# Patient Record
Sex: Female | Born: 1935 | Race: White | Hispanic: No | Marital: Married | Smoking: Never smoker
Health system: Southern US, Community
[De-identification: ages and names within clinical notes are randomized; demographics above are authoritative.]

## PROBLEM LIST (undated history)

## (undated) DIAGNOSIS — E119 Type 2 diabetes mellitus without complications: Secondary | ICD-10-CM

## (undated) DIAGNOSIS — I639 Cerebral infarction, unspecified: Secondary | ICD-10-CM

## (undated) HISTORY — PX: CHOLECYSTECTOMY: SHX55

---

## 2001-06-11 ENCOUNTER — Encounter: Payer: Self-pay | Admitting: Family Medicine

## 2001-06-11 ENCOUNTER — Ambulatory Visit (HOSPITAL_COMMUNITY): Admission: RE | Admit: 2001-06-11 | Discharge: 2001-06-11 | Payer: Self-pay | Admitting: Family Medicine

## 2001-07-21 ENCOUNTER — Ambulatory Visit (HOSPITAL_COMMUNITY): Admission: RE | Admit: 2001-07-21 | Discharge: 2001-07-21 | Payer: Self-pay | Admitting: Family Medicine

## 2001-07-21 ENCOUNTER — Encounter: Payer: Self-pay | Admitting: Family Medicine

## 2001-08-25 ENCOUNTER — Ambulatory Visit (HOSPITAL_COMMUNITY): Admission: RE | Admit: 2001-08-25 | Discharge: 2001-08-25 | Payer: Self-pay | Admitting: Family Medicine

## 2001-08-25 ENCOUNTER — Encounter: Payer: Self-pay | Admitting: Family Medicine

## 2002-05-26 ENCOUNTER — Encounter (HOSPITAL_COMMUNITY): Admission: RE | Admit: 2002-05-26 | Discharge: 2002-06-25 | Payer: Self-pay | Admitting: Family Medicine

## 2002-06-29 ENCOUNTER — Other Ambulatory Visit: Admission: RE | Admit: 2002-06-29 | Discharge: 2002-06-29 | Payer: Self-pay | Admitting: Dermatology

## 2002-07-08 ENCOUNTER — Encounter: Payer: Self-pay | Admitting: Family Medicine

## 2002-07-08 ENCOUNTER — Ambulatory Visit (HOSPITAL_COMMUNITY): Admission: RE | Admit: 2002-07-08 | Discharge: 2002-07-08 | Payer: Self-pay | Admitting: Family Medicine

## 2003-07-09 ENCOUNTER — Ambulatory Visit (HOSPITAL_COMMUNITY): Admission: RE | Admit: 2003-07-09 | Discharge: 2003-07-09 | Payer: Self-pay | Admitting: Internal Medicine

## 2003-07-13 ENCOUNTER — Ambulatory Visit (HOSPITAL_COMMUNITY): Admission: RE | Admit: 2003-07-13 | Discharge: 2003-07-13 | Payer: Self-pay | Admitting: Family Medicine

## 2003-07-13 ENCOUNTER — Encounter (INDEPENDENT_AMBULATORY_CARE_PROVIDER_SITE_OTHER): Payer: Self-pay | Admitting: Internal Medicine

## 2003-07-13 ENCOUNTER — Encounter: Payer: Self-pay | Admitting: Family Medicine

## 2003-07-13 ENCOUNTER — Ambulatory Visit (HOSPITAL_COMMUNITY): Admission: RE | Admit: 2003-07-13 | Discharge: 2003-07-13 | Payer: Self-pay | Admitting: Internal Medicine

## 2004-01-11 ENCOUNTER — Inpatient Hospital Stay (HOSPITAL_COMMUNITY): Admission: RE | Admit: 2004-01-11 | Discharge: 2004-01-14 | Payer: Self-pay | Admitting: General Surgery

## 2004-12-04 ENCOUNTER — Ambulatory Visit (HOSPITAL_COMMUNITY): Admission: RE | Admit: 2004-12-04 | Discharge: 2004-12-04 | Payer: Self-pay | Admitting: Family Medicine

## 2005-02-07 ENCOUNTER — Ambulatory Visit (HOSPITAL_COMMUNITY): Admission: RE | Admit: 2005-02-07 | Discharge: 2005-02-07 | Payer: Self-pay | Admitting: Family Medicine

## 2005-03-29 ENCOUNTER — Ambulatory Visit: Payer: Self-pay | Admitting: Internal Medicine

## 2005-04-05 ENCOUNTER — Ambulatory Visit: Payer: Self-pay | Admitting: Internal Medicine

## 2005-05-02 ENCOUNTER — Ambulatory Visit: Payer: Self-pay | Admitting: Internal Medicine

## 2005-05-02 ENCOUNTER — Ambulatory Visit (HOSPITAL_COMMUNITY): Admission: RE | Admit: 2005-05-02 | Discharge: 2005-05-02 | Payer: Self-pay | Admitting: Internal Medicine

## 2005-05-03 ENCOUNTER — Ambulatory Visit (HOSPITAL_COMMUNITY): Admission: RE | Admit: 2005-05-03 | Discharge: 2005-05-03 | Payer: Self-pay | Admitting: Otolaryngology

## 2005-06-05 ENCOUNTER — Ambulatory Visit: Payer: Self-pay | Admitting: Internal Medicine

## 2005-09-13 ENCOUNTER — Ambulatory Visit: Payer: Self-pay | Admitting: Internal Medicine

## 2005-12-10 ENCOUNTER — Ambulatory Visit (HOSPITAL_COMMUNITY): Admission: RE | Admit: 2005-12-10 | Discharge: 2005-12-10 | Payer: Self-pay | Admitting: Family Medicine

## 2006-09-12 ENCOUNTER — Ambulatory Visit: Payer: Self-pay | Admitting: Internal Medicine

## 2007-01-13 ENCOUNTER — Ambulatory Visit (HOSPITAL_COMMUNITY): Admission: RE | Admit: 2007-01-13 | Discharge: 2007-01-13 | Payer: Self-pay | Admitting: Internal Medicine

## 2007-07-19 ENCOUNTER — Encounter: Payer: Self-pay | Admitting: Orthopedic Surgery

## 2008-02-13 ENCOUNTER — Ambulatory Visit (HOSPITAL_COMMUNITY): Admission: RE | Admit: 2008-02-13 | Discharge: 2008-02-13 | Payer: Self-pay | Admitting: Internal Medicine

## 2009-04-01 ENCOUNTER — Ambulatory Visit (HOSPITAL_COMMUNITY): Admission: RE | Admit: 2009-04-01 | Discharge: 2009-04-01 | Payer: Self-pay | Admitting: Internal Medicine

## 2009-05-26 ENCOUNTER — Ambulatory Visit (HOSPITAL_COMMUNITY): Admission: RE | Admit: 2009-05-26 | Discharge: 2009-05-26 | Payer: Self-pay | Admitting: Internal Medicine

## 2009-07-18 ENCOUNTER — Ambulatory Visit (HOSPITAL_COMMUNITY): Admission: RE | Admit: 2009-07-18 | Discharge: 2009-07-18 | Payer: Self-pay | Admitting: Orthopedic Surgery

## 2009-07-18 ENCOUNTER — Encounter: Payer: Self-pay | Admitting: Orthopedic Surgery

## 2009-07-21 ENCOUNTER — Ambulatory Visit: Payer: Self-pay | Admitting: Orthopedic Surgery

## 2009-07-21 DIAGNOSIS — M5137 Other intervertebral disc degeneration, lumbosacral region: Secondary | ICD-10-CM

## 2009-07-27 ENCOUNTER — Encounter (INDEPENDENT_AMBULATORY_CARE_PROVIDER_SITE_OTHER): Payer: Self-pay | Admitting: *Deleted

## 2009-07-27 ENCOUNTER — Encounter: Payer: Self-pay | Admitting: Orthopedic Surgery

## 2009-08-01 ENCOUNTER — Telehealth: Payer: Self-pay | Admitting: Orthopedic Surgery

## 2009-08-08 ENCOUNTER — Encounter: Payer: Self-pay | Admitting: Orthopedic Surgery

## 2009-08-15 ENCOUNTER — Ambulatory Visit: Payer: Self-pay | Admitting: Orthopedic Surgery

## 2009-08-15 ENCOUNTER — Encounter (INDEPENDENT_AMBULATORY_CARE_PROVIDER_SITE_OTHER): Payer: Self-pay | Admitting: *Deleted

## 2009-08-16 ENCOUNTER — Telehealth: Payer: Self-pay | Admitting: Orthopedic Surgery

## 2009-08-24 ENCOUNTER — Encounter (INDEPENDENT_AMBULATORY_CARE_PROVIDER_SITE_OTHER): Payer: Self-pay | Admitting: *Deleted

## 2009-08-30 ENCOUNTER — Encounter: Admission: RE | Admit: 2009-08-30 | Discharge: 2009-08-30 | Payer: Self-pay | Admitting: Orthopedic Surgery

## 2009-09-13 ENCOUNTER — Encounter: Admission: RE | Admit: 2009-09-13 | Discharge: 2009-09-13 | Payer: Self-pay | Admitting: Orthopedic Surgery

## 2009-12-29 ENCOUNTER — Ambulatory Visit (HOSPITAL_COMMUNITY): Admission: RE | Admit: 2009-12-29 | Discharge: 2009-12-29 | Payer: Self-pay | Admitting: Ophthalmology

## 2010-01-19 ENCOUNTER — Ambulatory Visit (HOSPITAL_COMMUNITY): Admission: RE | Admit: 2010-01-19 | Discharge: 2010-01-19 | Payer: Self-pay | Admitting: Ophthalmology

## 2010-05-17 ENCOUNTER — Ambulatory Visit (HOSPITAL_COMMUNITY): Admission: RE | Admit: 2010-05-17 | Discharge: 2010-05-17 | Payer: Self-pay | Admitting: Internal Medicine

## 2010-07-06 ENCOUNTER — Ambulatory Visit (HOSPITAL_COMMUNITY): Admission: RE | Admit: 2010-07-06 | Discharge: 2010-07-06 | Payer: Self-pay | Admitting: Endocrinology

## 2010-09-14 ENCOUNTER — Emergency Department (HOSPITAL_COMMUNITY)
Admission: EM | Admit: 2010-09-14 | Discharge: 2010-09-14 | Payer: Self-pay | Source: Home / Self Care | Admitting: Emergency Medicine

## 2010-10-23 ENCOUNTER — Ambulatory Visit
Admission: RE | Admit: 2010-10-23 | Discharge: 2010-10-23 | Payer: Self-pay | Source: Home / Self Care | Attending: Internal Medicine | Admitting: Internal Medicine

## 2010-10-27 NOTE — Consult Note (Signed)
  NAMEEILIANA, [name]          ACCOUNT NO.:  [id number]  MEDICAL RECORD NO.:  [id number]          PATIENT TYPE:  EMS  LOCATION:  ED                            FACILITY:  [facility]  PHYSICIAN:  Lionel December, M.D.    DATE OF BIRTH:  06-Nov-1930  DATE OF CONSULTATION: DATE OF DISCHARGE:  09/14/2010                                CONSULTATION   ADDENDUM:  RECOMMENDATIONS:  We will schedule a colonoscopy with Dr. Karilyn Cota.  Her last colonoscopy was in 2006.  She has stopped the Motrin.  Her epigastric pain is better.  She will increase fiber in her diet.    ______________________________ Dorene Ar, NP   ______________________________ Lionel December, M.D.    TS/MEDQ  D:  10/23/2010  T:  10/24/2010  Job:  [id number]  Electronically Signed by Dorene Ar PA on 10/26/2010 05:24:07 PM Electronically Signed by Lionel December M.D. on 10/27/2010 12:48:08 PM

## 2010-10-27 NOTE — Consult Note (Signed)
[name]          ACCOUNT NO.:  [id number]  MEDICAL RECORD NO.:  [id number]          PATIENT TYPE:  EMS  LOCATION:  ED                            FACILITY:  [facility]  PHYSICIAN:  Lionel December, M.D.    DATE OF BIRTH:  1930/12/31  DATE OF CONSULTATION: DATE OF DISCHARGE:  09/14/2010                                CONSULTATION   REASON FOR CONSULT:  Gastritis, gastroparesis.  HISTORY OF PRESENT ILLNESS:  [name] is a 77 year old female of Dr. Scharlene Gloss presenting today with complaints of loose stools, gas and bloating, and epigastric pain. She states she is having 2-3 loose stools a day for about a month.  She, however, does state her stools are not always loose, but soft.  She thinks the Janumet that she started a month ago may have contributed to her loose stools.  She occasionally has esophageal burning. She occasionally has acid reflux.  She complains of bloating after eating. She continues to have epigastric pain and frequent burping. She had been on Motrin 800mg  BID, but stopped a month ago. Her normal weight is 175.  She is down to 163, but this weight loss has been intentional. She has not been on any recent antibiotics and she has not been out  of the country.  Her last colonoscopy and EGD was May 02, 2005.  The EGD revealed normal examination of the esophagus, nonerosive antral gastritis, coarse appearance to post bulbar mucosa, biopsy taken to rule out mucosal disease. Stomach biopsy revealed benign fragments of mucosal and villous  architecture reminiscent of duodental orgin. Colonoscopy revealed no evidence of colitis. Three small polyps, two were at the hepatic flexure and one at the cecum, appendiceal orifice, a tiny nonbleeding,                                                     AV malformation which was left alone and small external hemorrhoids.  The biopsy of the colon revealed adenomatous polyp with predominately villous architecture. No tumor  seen. Recommendations were colonoscopy in 5 years.  MEDICATIONS: 1. She is on Align one a day. 2. Calcium 1200 mg plus one a day. 3. Vitamin D3 1000 units once a day. 4. Motrin/hydrochlorothiazide 100/25 tabs daily. 5. Janumet 50 mg/100 mg b.i.d. 6. Gas-X at bedtime. 7. Xanax 0.5 as needed. 8. ASA 81 mg which is on hold right now. 9. WelChol 3.75 mg a day. 10.She was taking alendronate 70 mg, but not now. 11.Prilosec one a day.  ALLERGIES:  She is allergic to CODEINE, whole ASPIRIN and MOTRIN hurt her stomach.  SURGERIES:  She has had a cholecystectomy x2, one was to remove the gallstones, one was to remove the actual gallbladder.  She has had a partial hysterectomy.  She has had bilateral cataracts surgery.  PAST MEDICAL HISTORY:  Hypertension, high cholesterol and she has been a diabetic type 2 for well over a year.  FAMILY HISTORY:  Her mother is deceased from a CVA.  Her father is deceased from a CVA.  Six sisters, 2 are alive and 4 are deceased.  She really could not tell me the history.  She had 4 brothers, one is deceased from COPD and three are alive.  SOCIAL HISTORY:  She is married.  She is retired from farming.  She does not smoke, drink or do drugs and she has 3 children in good health.  PHYSICAL EXAMINATION:  VITAL SIGNS:  Her weight is 163.3, her height is 5 feet, her BMI is 32, temp is 98.1, blood pressure is 110/60, pulse is 76. MOUTH:  Natural teeth.  Her oral mucosa is moist and there are no lesions. EYES:  The conjunctivae are pink.  Her sclerae are anicteric. NECK:  Her thyroid is normal.  There is no cervical lymphadenopathy. LUNGS:  Clear. HEART:  Regular rate and rhythm. ABDOMEN:  Soft.  Bowel sounds are positive.  No masses.  No tenderness.  ASSESSMENT:  [name] is a 77 year old female with complaints of loose stools and bloating for about a month. She also continues to have epigastric pain. She does have occasional acid reflux. PUD also  needs to be ruled.  Hx of adenomatous polyps in 2006.  RECOMMENDATIONS:  She will increase fiber in her diet and we will schedule her for colonoscopy given her history of adenomatous polyps .Will also schedule an EGD to rule out PUD.     ______________________________ Dorene Ar, NP   ______________________________ Lionel December, M.D.    TS/MEDQ  D:  10/23/2010  T:  10/24/2010  Job:  [id number]  cc:   Catalina Pizza, M.D. Fax: [phone]  Electronically Signed by Dorene Ar PA on 10/24/2010 09:12:51 AM Electronically Signed by Lionel December M.D. on 10/27/2010 12:47:53 PM

## 2010-11-23 ENCOUNTER — Encounter (HOSPITAL_BASED_OUTPATIENT_CLINIC_OR_DEPARTMENT_OTHER): Payer: Medicare HMO | Admitting: Internal Medicine

## 2010-11-23 ENCOUNTER — Other Ambulatory Visit (INDEPENDENT_AMBULATORY_CARE_PROVIDER_SITE_OTHER): Payer: Self-pay | Admitting: Internal Medicine

## 2010-11-23 ENCOUNTER — Ambulatory Visit (HOSPITAL_COMMUNITY)
Admission: RE | Admit: 2010-11-23 | Discharge: 2010-11-23 | Disposition: A | Discharge status: Home / Self Care | Payer: Medicare HMO | Source: Ambulatory Visit | Attending: Internal Medicine | Admitting: Internal Medicine

## 2010-11-23 DIAGNOSIS — R110 Nausea: Secondary | ICD-10-CM | POA: Insufficient documentation

## 2010-11-23 DIAGNOSIS — R197 Diarrhea, unspecified: Secondary | ICD-10-CM

## 2010-11-23 DIAGNOSIS — R1013 Epigastric pain: Secondary | ICD-10-CM

## 2010-11-23 DIAGNOSIS — Z860100 Personal history of colon polyps, unspecified: Secondary | ICD-10-CM | POA: Insufficient documentation

## 2010-11-23 DIAGNOSIS — I10 Essential (primary) hypertension: Secondary | ICD-10-CM | POA: Insufficient documentation

## 2010-11-23 DIAGNOSIS — K259 Gastric ulcer, unspecified as acute or chronic, without hemorrhage or perforation: Secondary | ICD-10-CM | POA: Insufficient documentation

## 2010-11-23 DIAGNOSIS — D126 Benign neoplasm of colon, unspecified: Secondary | ICD-10-CM

## 2010-11-23 DIAGNOSIS — Z7982 Long term (current) use of aspirin: Secondary | ICD-10-CM | POA: Insufficient documentation

## 2010-11-23 DIAGNOSIS — Z79899 Other long term (current) drug therapy: Secondary | ICD-10-CM | POA: Insufficient documentation

## 2010-11-23 DIAGNOSIS — K255 Chronic or unspecified gastric ulcer with perforation: Secondary | ICD-10-CM

## 2010-11-23 DIAGNOSIS — R141 Gas pain: Secondary | ICD-10-CM

## 2010-11-23 DIAGNOSIS — K644 Residual hemorrhoidal skin tags: Secondary | ICD-10-CM | POA: Insufficient documentation

## 2010-11-23 DIAGNOSIS — E119 Type 2 diabetes mellitus without complications: Secondary | ICD-10-CM | POA: Insufficient documentation

## 2010-11-23 LAB — GLUCOSE, CAPILLARY

## 2010-11-24 LAB — H. PYLORI ANTIBODY, IGG: H Pylori IgG: 0.73 {ISR}

## 2010-11-25 ENCOUNTER — Inpatient Hospital Stay (HOSPITAL_COMMUNITY)
Admission: EM | Admit: 2010-11-25 | Discharge: 2010-11-28 | DRG: 066 | Disposition: A | Discharge status: Home / Self Care | Payer: Medicare HMO | Attending: Internal Medicine | Admitting: Internal Medicine

## 2010-11-25 ENCOUNTER — Emergency Department (HOSPITAL_COMMUNITY): Payer: Medicare HMO

## 2010-11-25 DIAGNOSIS — R1310 Dysphagia, unspecified: Secondary | ICD-10-CM | POA: Diagnosis present

## 2010-11-25 DIAGNOSIS — E119 Type 2 diabetes mellitus without complications: Secondary | ICD-10-CM | POA: Diagnosis present

## 2010-11-25 DIAGNOSIS — I672 Cerebral atherosclerosis: Secondary | ICD-10-CM | POA: Diagnosis present

## 2010-11-25 DIAGNOSIS — R471 Dysarthria and anarthria: Secondary | ICD-10-CM | POA: Diagnosis present

## 2010-11-25 DIAGNOSIS — R29810 Facial weakness: Secondary | ICD-10-CM | POA: Diagnosis present

## 2010-11-25 DIAGNOSIS — E785 Hyperlipidemia, unspecified: Secondary | ICD-10-CM | POA: Diagnosis present

## 2010-11-25 DIAGNOSIS — I6350 Cerebral infarction due to unspecified occlusion or stenosis of unspecified cerebral artery: Principal | ICD-10-CM | POA: Diagnosis present

## 2010-11-25 DIAGNOSIS — E876 Hypokalemia: Secondary | ICD-10-CM | POA: Diagnosis present

## 2010-11-25 DIAGNOSIS — R279 Unspecified lack of coordination: Secondary | ICD-10-CM | POA: Diagnosis present

## 2010-11-25 DIAGNOSIS — K259 Gastric ulcer, unspecified as acute or chronic, without hemorrhage or perforation: Secondary | ICD-10-CM | POA: Diagnosis present

## 2010-11-25 DIAGNOSIS — I10 Essential (primary) hypertension: Secondary | ICD-10-CM | POA: Diagnosis present

## 2010-11-25 LAB — APTT: aPTT: 31 seconds (ref 24–37)

## 2010-11-25 LAB — HEMOGLOBIN A1C
Hgb A1c MFr Bld: 6.1 % — ABNORMAL HIGH (ref <5.7)
Mean Plasma Glucose: 128 mg/dL — ABNORMAL HIGH (ref <117)

## 2010-11-25 LAB — URINALYSIS, ROUTINE W REFLEX MICROSCOPIC
Bilirubin Urine: NEGATIVE
Nitrite: NEGATIVE
Specific Gravity, Urine: <1.005 — ABNORMAL LOW (ref 1.005–1.030)
Urobilinogen, UA: 0.2 mg/dL (ref 0.0–1.0)
pH: 5.5 (ref 5.0–8.0)

## 2010-11-25 LAB — HEPATIC FUNCTION PANEL
AST: 23 U/L (ref 0–37)
Bilirubin, Direct: 0.1 mg/dL (ref 0.0–0.3)
Indirect Bilirubin: 0.5 mg/dL (ref 0.3–0.9)
Total Bilirubin: 0.6 mg/dL (ref 0.3–1.2)

## 2010-11-25 LAB — CK TOTAL AND CKMB (NOT AT ARMC)
CK, MB: 5.5 ng/mL — ABNORMAL HIGH (ref 0.3–4.0)
Total CK: 154 U/L (ref 7–177)

## 2010-11-25 LAB — GLUCOSE, CAPILLARY
Glucose-Capillary: 138 mg/dL — ABNORMAL HIGH (ref 70–99)
Glucose-Capillary: 152 mg/dL — ABNORMAL HIGH (ref 70–99)
Glucose-Capillary: 127 mg/dL — ABNORMAL HIGH (ref 70–99)

## 2010-11-25 LAB — CBC
HCT: 39.5 % (ref 36.0–46.0)
Hemoglobin: 13.5 g/dL (ref 12.0–15.0)
RBC: 4.63 MIL/uL (ref 3.87–5.11)
WBC: 8.7 10*3/uL (ref 4.0–10.5)

## 2010-11-25 LAB — PROTIME-INR
INR: 0.85 (ref 0.00–1.49)
Prothrombin Time: 11.8 seconds (ref 11.6–15.2)

## 2010-11-25 LAB — MAGNESIUM: Magnesium: 1.1 mg/dL — ABNORMAL LOW (ref 1.5–2.5)

## 2010-11-25 LAB — DIFFERENTIAL
Basophils Absolute: 0 10*3/uL (ref 0.0–0.1)
Basophils Relative: 0 % (ref 0–1)
Lymphocytes Relative: 20 % (ref 12–46)
Monocytes Absolute: 0.4 10*3/uL (ref 0.1–1.0)
Neutro Abs: 6.5 10*3/uL (ref 1.7–7.7)
Neutrophils Relative %: 75 % (ref 43–77)

## 2010-11-25 LAB — BASIC METABOLIC PANEL
CO2: 27 mEq/L (ref 19–32)
Calcium: 9.1 mg/dL (ref 8.4–10.5)
GFR calc Af Amer: >60 mL/min (ref >60)
Glucose, Bld: 128 mg/dL — ABNORMAL HIGH (ref 70–99)
Potassium: 3.1 mEq/L — ABNORMAL LOW (ref 3.5–5.1)
Sodium: 139 mEq/L (ref 135–145)

## 2010-11-25 LAB — CARDIAC PANEL(CRET KIN+CKTOT+MB+TROPI)
Relative Index: 2.6 — ABNORMAL HIGH (ref 0.0–2.5)
Troponin I: 0.01 ng/mL (ref 0.00–0.06)

## 2010-11-25 LAB — TSH: TSH: 0.592 u[IU]/mL (ref 0.350–4.500)

## 2010-11-25 NOTE — H&P (Signed)
Brenda, Merritt          ACCOUNT NO.:  [id number]  MEDICAL RECORD NO.:  [id number]           PATIENT TYPE:  I  LOCATION:  A328                          FACILITY:  [facility]  PHYSICIAN:  Elliot Cousin, M.D.    DATE OF BIRTH:  1931/03/16  DATE OF ADMISSION:  11/25/2010 DATE OF DISCHARGE:  LH                             HISTORY & PHYSICAL   PRIMARY CARE PHYSICIAN:  Dr. Dwana Melena.  PRIMARY GASTROENTEROLOGIST:  Dr. Lionel December.  CHIEF COMPLAINT:  Slurred speech.  HISTORY OF PRESENT ILLNESS:  The patient is a 77 year old woman with a past medical history significant for hypertension, type 2 diabetes mellitus, and peptic ulcer disease, who presents to the emergency department today with a chief complaint of slurred speech.  Her slurred speech began at approximately 7:30 p.m. last night.  She was speaking to her husband at that time.  Her husband did notice her slurred speech. She had some difficulty chewing her food last night, but no current complaint.  She also had mild drooling, however, she could not recall which side that she drooled on.  Her husband cannot recall as well.  She also developed neck pain and a global headache.  She also had some transient dizziness.  She denies focal weakness of her upper or lower extremities, visual changes, numbness, chest pain, shortness of breath, or worsening incontinence of her bladder and bowels.  She does admit to chronic abdominal pain and loose stools.  Of note, she is right handed.  In the emergency department, the patient is noted to be hemodynamically stable and afebrile.  Her EKG reveals normal sinus rhythm with left axis deviation, low-voltage QRS, and a heart rate of 72 beats per minute. The CT scan of her head reveals no acute intracranial abnormalities but advanced chronic small vessel white matter ischemic changes are noted.  Her lab data are significant for a serum potassium of 3.1, otherwise unremarkable.  She is  being admitted for further evaluation and management.  PAST MEDICAL HISTORY: 1. Recent EGD on November 23, 2010, by Dr. Karilyn Cota showed a small ulcer at     the gastric body, likely secondary to prior NSAID therapy. 2. Colonoscopy on November 23, 2010, by Dr. Karilyn Cota revealed colon     polyps and small external hemorrhoids, 2 of the smaller polyps were     ablated. 3. Hypertension. 4. Type 2 diabetes mellitus. 5. Hyperlipidemia. 6. Chronic anemia. 7. Osteoporosis. 8. Degenerative joint disease. 9. Chronic anxiety, although the patient states that she takes Xanax     primarily for sleep. 10.History of laparoscopic cholecystectomy. 11.Chronic low back pain with a bulging disk in one of her lumbar     vertebrae. 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  ALLERGIES:  The patient has an allergy to CODEINE.  She is also intolerant to statin medications which cause her muscles to ache.  HOME MEDICATIONS: 1. Alendronate 70 mg wekly, recently stopped . 2. Align probiotic 1 tablet daily. 3. Aspirin 81 mg daily, recently stopped. 4. Calcium 500 mg b.i.d. 5. Fish oil 1000 mg daily or b.i.d. 6. Gas-X as needed. 7. Losartan 100 mg  daily. 8. Omeprazole 20 mg b.i.d. 9. Vitamin D3 once daily. 10.Xanax 1 tablet at bedtime. 11.Janumet b.i.d. 12.Ibuprofen was recently discontinued. 13.The patient was unaware of the doses of some of her medications     dictated above.  SOCIAL HISTORY:  The patient is married.  [place]  She has 3 sons.  She is a retired Visual merchandiser.  She still drives. She denies tobacco, alcohol, and illicit drug use.  FAMILY HISTORY:  Her mother died of complications of a stroke at 45 years of age.  Her father also died of complications of a stroke at 75 years of age.  REVIEW OF SYSTEMS:  The patient's review of systems is positive for chronic abdominal pain, chronic loose stools, neck pain, and back pain. She also has chronic  bilateral hip pain.  Otherwise, review of systems is negative.  PHYSICAL EXAMINATION:  VITAL SIGNS:  Temperature 98.6, blood pressure 128/83, pulse 75, respiratory rate 20, oxygen saturation 97% on room air.  GENERAL:  The patient is a pleasant 77 year old Caucasian woman who is currently sitting up in bed, in no acute distress. HEENT:  Head is normocephalic, nontraumatic.  Pupils are equal, round, and reactive to light.  Extraocular movements are intact.  Conjunctivae are clear.  Sclerae are white.  Tympanic membranes are clear bilaterally.  Nasal mucosa is mildly dry.  No sinus tenderness. Oropharynx reveals moist mucous membranes.  No posterior exudates or erythema. NECK:  Supple.  No adenopathy, no thyromegaly, no bruit, no JVD. LUNGS:  Clear to auscultation bilaterally. HEART:  S1, S2 with a 1/6 to 2/6 systolic murmur. ABDOMEN:  Positive bowel sounds, soft, mildly tender in the epigastrium, otherwise no hepatosplenomegaly, no masses palpated, and no distention. GU/RECTAL:  Deferred. EXTREMITIES:  Pedal pulses are palpable bilaterally.  No pretibial edema and no pedal edema. NEUROLOGIC:  The patient is alert and oriented x3.  There is dysarthria. There is scant facial droop which initially appeared to be on the left but may be on the right.  She did have slight tongue deviation to the left.  Otherwise, cranial nerves are all intact.  There is no evidence of expressive or receptive aphasia.  Pronator drift essentially negative.  Strength of the upper extremities 5/5, strength of the lower extremities 5/5.  Sensation is intact globally.  Gait not assessed. Cerebellar with finger-to-nose bilaterally is intact.  ADMISSION LABORATORIES:  CT scan of the head results and EKG results as dictated above.  WBC 8.7, hemoglobin 13.5, platelet count 231.  Sodium 139, potassium 3.1, chloride 102, CO2 27, glucose 128, BUN 10, creatinine 0.72, calcium 9.1.   ASSESSMENT: 1. Dysarthria, likely  secondary to an acute stroke. 2. Recent diagnosis of peptic ulcer disease and colon polyps.  Aspirin     was discontinued for 10 days due to the findings. 3. Type 2 diabetes mellitus, currently controlled. 4. Hypertension, currently controlled.  PLAN: 1. We will start a stroke evaluation/workup by ordering an MRI of the     brain, MRA of the brain, carotid ultrasound, 2-D echocardiogram,     and a number of laboratory studies. 2. We will hold on treating the patient with aspirin as per Dr.     Patty Sermons recommendation on November 23, 2010.     I will discuss this with him further. 3. We will give the patient Zocor once as it is a secondary     preventative measure for stroke even though the patient does have a  history of myalgias.  We will give her one dose today and then     reassess her for symptoms. 4. We will replete her potassium chloride orally and in the IV     fluids.  We will check a magnesium level to rule out deficiency. 5. We will ask the speech therapist to evaluate the patient primarily     for dysarthria.  Apparently, she     did pass the bedside swallow evaluation by the ED     registered nurse.     Elliot Cousin, M.D.     DF/MEDQ  D:  11/25/2010  T:  11/25/2010  Job:  [id number]  cc:   Catalina Pizza, M.D. Fax: [phone]  Lionel December, M.D. Fax: [phone]  Electronically Signed by Elliot Cousin M.D. on 11/25/2010 05:08:35 PM

## 2010-11-26 ENCOUNTER — Inpatient Hospital Stay (HOSPITAL_COMMUNITY): Payer: Medicare HMO

## 2010-11-26 LAB — COMPREHENSIVE METABOLIC PANEL
ALT: 22 U/L (ref 0–35)
AST: 16 U/L (ref 0–37)
Alkaline Phosphatase: 40 U/L (ref 39–117)
CO2: 29 mEq/L (ref 19–32)
Chloride: 108 mEq/L (ref 96–112)
GFR calc Af Amer: >60 mL/min (ref >60)
GFR calc non Af Amer: >60 mL/min (ref >60)
Glucose, Bld: 121 mg/dL — ABNORMAL HIGH (ref 70–99)
Sodium: 143 mEq/L (ref 135–145)
Total Bilirubin: 0.8 mg/dL (ref 0.3–1.2)

## 2010-11-26 LAB — DIFFERENTIAL
Basophils Absolute: 0 10*3/uL (ref 0.0–0.1)
Basophils Relative: 0 % (ref 0–1)
Lymphocytes Relative: 27 % (ref 12–46)
Monocytes Relative: 7 % (ref 3–12)
Neutro Abs: 5.2 10*3/uL (ref 1.7–7.7)
Neutrophils Relative %: 65 % (ref 43–77)

## 2010-11-26 LAB — CBC
HCT: 36.9 % (ref 36.0–46.0)
Hemoglobin: 12.7 g/dL (ref 12.0–15.0)
RBC: 4.26 MIL/uL (ref 3.87–5.11)

## 2010-11-26 LAB — LIPID PANEL
HDL: 45 mg/dL (ref >39)
Total CHOL/HDL Ratio: 4.7 RATIO
VLDL: 26 mg/dL (ref 0–40)

## 2010-11-26 LAB — GLUCOSE, CAPILLARY: Glucose-Capillary: 111 mg/dL — ABNORMAL HIGH (ref 70–99)

## 2010-11-26 LAB — MAGNESIUM: Magnesium: 1.8 mg/dL (ref 1.5–2.5)

## 2010-11-27 ENCOUNTER — Inpatient Hospital Stay (HOSPITAL_COMMUNITY): Payer: Medicare HMO

## 2010-11-27 DIAGNOSIS — I059 Rheumatic mitral valve disease, unspecified: Secondary | ICD-10-CM

## 2010-11-27 LAB — BASIC METABOLIC PANEL
BUN: 6 mg/dL (ref 6–23)
Calcium: 8.7 mg/dL (ref 8.4–10.5)
Creatinine, Ser: 0.66 mg/dL (ref 0.4–1.2)
GFR calc non Af Amer: >60 mL/min (ref >60)
Glucose, Bld: 121 mg/dL — ABNORMAL HIGH (ref 70–99)

## 2010-11-27 LAB — GLUCOSE, CAPILLARY: Glucose-Capillary: 126 mg/dL — ABNORMAL HIGH (ref 70–99)

## 2010-11-28 LAB — GLUCOSE, CAPILLARY: Glucose-Capillary: 154 mg/dL — ABNORMAL HIGH (ref 70–99)

## 2010-12-01 NOTE — Discharge Summary (Signed)
[name], [name]          ACCOUNT NO.:  [id number]  MEDICAL RECORD NO.:  [id number]           PATIENT TYPE:  I  LOCATION:  A328                          FACILITY:  [facility]  PHYSICIAN:  [name], M.D.    DATE OF BIRTH:  May 03, 1931  DATE OF ADMISSION:  11/25/2010 DATE OF DISCHARGE:  03/06/2012LH                              DISCHARGE SUMMARY   DISCHARGE DIAGNOSES: 1. Acute left brain stroke. 2. Moderate to severe cerebrovascular disease, primarily chronic small     vessel disease. 3. Dysarthria, dysphagia, and a clumsy right hand secondary to the     acute left brain stroke. 4. Hypertension with low normal blood pressures during the     hospitalization. 5. Hyperlipidemia.  The patient's fasting lipid profile revealed a     total cholesterol of 211, triglycerides of 130, HDL cholesterol of     45, and LDL cholesterol of 140.  The patient had been intolerant to     statins in the past.  However, she was discharged to home on a      smaller dose of Zocor at 10 mg daily for secondary prevention. 6. Type 2 diabetes mellitus, well controlled.  The patient's     hemoglobin A1c was 6.1. 7. Preserved left ventricular function with an ejection fraction of 65-     70%, mild left ventricular hypertrophy, and grade 1 diastolic     dysfunction per 2-D echocardiogram on November 27, 2010. 8. Hypomagnesemia. 9. Hypokalemia. 10.Recent diagnosis of peptic ulcer disease.  DISCHARGE MEDICATIONS: 1. Magnesium oxide 400 mg b.i.d. (new medication). 2. Plavix 75 mg daily (new medication). 3. Zocor 10 mg nightly. 4. Losartan 100 mg tablet.  The patient was advised to take half a     tablet daily and to not take this medication unless her systolic     blood pressure was greater than 120. 5. Align 4 mg 1 capsule daily. 6. Alprazolam 0.5 mg nightly. 7. Calcium 600 mg b.i.d. 8. Fish oil 1 capsule b.i.d. 9. Janumet 50/1000 mg daily. 10.Omeprazole 20 mg b.i.d. 11.Vitamin D3 one tablet  daily. 12.Stop aspirin.  DISCHARGE DISPOSITION:  The patient was discharged to home in improved and stable condition on November 25, 2010.  She was advised to follow up with Dr. Margo Aye on December 06, 2010, at 3:45 p.m.  She will follow up with both the speech therapist and occupational therapist on December 11, 2010, at noon.  CONSULTATIONS:  Speech therapist, occupational therapist, and physical therapist.  PROCEDURES PERFORMED: 1. MRI of the brain on November 27, 2010.  The results revealed acute left     MCA territory infarct primarily affecting the left superior     temporal gyrus and lateral perirolandic cortex.  No mass effect or     associated hemorrhage.  Underlying moderate to severe signal     changes most compatible with chronic small vessel disease. 2. MRA of the brain on November 27, 2010.  The results revealed decreased     flow or occlusion suspected in a left MCA, M2 segment corresponding     to the territory of the infarcts.  Moderate probable  atherosclerotic stenosis of the left PCA distal P1 segment with     preserved distal flow. 3. Carotid artery ultrasound on November 26, 2010.  The results revealed     minimal carotid atherosclerosis.  No hemodynamically significant     ICA stenosis on either side. 4. A 2-D echocardiogram on November 27, 2010.  The results were dictated     above. 5. CT scan of the head on November 25, 2010.  The results revealed no     acute intracranial abnormality.  Advanced chronic small vessel     white matter ischemic changes.  HISTORY OF PRESENT ILLNESS:  The patient is a 77 year old woman with a past medical history significant for hypertension, type 2 diabetes mellitus, and peptic ulcer disease, who presented to the emergency department on November 25, 2010, with a chief complaint of slurred speech. The slurred speech had begun the night before she presented to the emergency department.  During the initial evaluation, she was noted to be hemodynamically stable  and afebrile.  Her EKG revealed normal sinus rhythm with a left axis deviation and low voltage QRS.  Her heart rate was 72 beats per minute.  The CT scan of her head revealed advanced chronic small vessel white matter ischemic changes, but no acute intracranial abnormalities.  Her lab data were significant only for serum potassium of 3.1.  She was admitted for further evaluation and management.  HOSPITAL COURSE: 1. ACUTE LEFT BRAIN STROKE WITH RESIDUAL CLUMSY RIGHT HAND, DYSPHAGIA,     AND DYSARTHRIA.  The patient passed the initial swallow evaluation     in the emergency department.  However, she did acknowledge having     some difficulty chewing on the right side.  She had been taking     aspirin at 81 mg daily for several years up until 2 weeks prior to     the EGD performed by Dr. Karilyn Cota on November 23, 2010.  Following the     results of the EGD which revealed a small gastric ulcer, Dr. Karilyn Cota     had advised the patient to discontinue aspirin for an additional 10     days.  Aspirin was withheld for the first 24 hours.  She was given     a 10 mg dose of Zocor empirically, although she had a history of     mild muscle aches on statin medications in the past.  Under the     circumstances, the benefits of the statin outweighed the     risk of myalgias.  The following day, I discussed starting aspirin     therapy with Dr. Karilyn Cota.  Following our discussion, we both agreed     that it would be in the patient's best interest to restart aspirin.     However, the dose was increased to 162 mg daily until the acute     stroke was confirmed radiographically.  A number of studies were     ordered to evaluate for an acute stroke and/or the etiology of her     symptoms.  Because of the weekend, the studies were delayed     for 24-48 hours.  Eventually, the MRI of her brain did confirm an     acute left brain stroke.  Following the confirmation, aspirin was     discontinued in favor of Plavix 75 mg  daily.  Plavix was also     discussed with Dr. Karilyn Cota who agreed with starting it.  The carotid  duplex ultrasound revealed no significant ICA stenosis.  Her 2-D     echocardiogram revealed diastolic dysfunction, mild LVH, but     preserved LV function.  The speech therapist was consulted.     Following her evaluation, she recommended downgrading the patient's     diet to a mechanical soft with pureed meats and thin liquids.  She     also provided the patient with speech exercises.  The occupational     therapist also evaluated the patient and recommended ongoing     outpatient therapy which was ordered.  The physical therapist     evaluated the patient and per her evaluation, the patient did not     need any further physical therapy.  In fact, the patient walked     daily with her family without difficulty. 2. HISTORY OF HYPERTENSION WITH RELATIVE HYPOTENSION.  The patient was     started on losartan 50 mg daily rather than her usual dose of 100     mg daily.  Parameters were ordered.  Her blood pressure was low     normal during the entire hospitalization.  She was started on     gentle IV fluids.  Because her blood pressure consistently stayed     in the 100s, she was advised to not take losartan at home     unless her systolic blood pressure was greater than 120.  The dose     was decreased to 50 mg daily.  Further management will be deferred     to Dr. Margo Aye. 3. HYPERLIPIDEMIA.  Her fasting lipid profile was assessed.  The     results were dictated above.  Based on the findings, the patient     was receptive to restarting statin medication.  Therefore, she was     started on Zocor at a relatively small dose.  She had no ill     effects during the hospitalization.  If she could tolerate the fish     oil, she was advised to restart fish oil at either 1 or 2 capsules     daily.  Of note, her CK was within normal limits.  All of her liver     enzymes were within normal limits as well.   Further management will     be deferred to her primary care physician, Dr. Margo Aye. 4. TYPE 2 DIABETES MELLITUS.  The patient's capillary blood glucose     was well controlled.  Janumet was withheld temporarily in favor of     sliding scale NovoLog.  Her hemoglobin A1c was 6.1.  She was     advised to restart Janumet upon discharge. 5. HYPOMAGNESEMIA AND HYPOKALEMIA.  The patient was repleted orally     and intravenously with potassium chloride and magnesium.  She was     given 2 g of magnesium sulfate when her blood magnesium was found     to be low at 1.1.  Her serum potassium was 3.1 on admission.     Following repletion, her serum potassium improved to 4.4 and her     blood magnesium improved to 1.5. 6. PEPTIC ULCER DISEASE.  The patient was maintained on proton pump     inhibitor therapy.     [name], M.D.     DF/MEDQ  D:  11/28/2010  T:  11/29/2010  Job:  [id number]  cc:   Catalina Pizza, M.D. Fax: [phone]  Lionel December, M.D. Fax: [phone]  Electronically Signed by  [name] M.D. on 11/30/2010 09:17:43 AM

## 2010-12-04 LAB — URINALYSIS, ROUTINE W REFLEX MICROSCOPIC
Ketones, ur: NEGATIVE mg/dL
Nitrite: NEGATIVE
Protein, ur: NEGATIVE mg/dL

## 2010-12-04 LAB — COMPREHENSIVE METABOLIC PANEL
ALT: 23 U/L (ref 0–35)
CO2: 30 mEq/L (ref 19–32)
Calcium: 9.1 mg/dL (ref 8.4–10.5)
Creatinine, Ser: 0.79 mg/dL (ref 0.4–1.2)
GFR calc Af Amer: >60 mL/min (ref >60)
GFR calc non Af Amer: >60 mL/min (ref >60)
Glucose, Bld: 129 mg/dL — ABNORMAL HIGH (ref 70–99)
Sodium: 136 mEq/L (ref 135–145)
Total Protein: 6 g/dL (ref 6.0–8.3)

## 2010-12-04 LAB — LIPASE, BLOOD: Lipase: 102 U/L — ABNORMAL HIGH (ref 11–59)

## 2010-12-04 LAB — CBC
HCT: 35.8 % — ABNORMAL LOW (ref 36.0–46.0)
MCH: 29.8 pg (ref 26.0–34.0)
MCHC: 35.8 g/dL (ref 30.0–36.0)
RDW: 13.2 % (ref 11.5–15.5)

## 2010-12-04 LAB — DIFFERENTIAL
Eosinophils Absolute: 0.1 10*3/uL (ref 0.0–0.7)
Lymphocytes Relative: 15 % (ref 12–46)
Lymphs Abs: 1.5 10*3/uL (ref 0.7–4.0)
Monocytes Relative: 4 % (ref 3–12)
Neutro Abs: 7.7 10*3/uL (ref 1.7–7.7)
Neutrophils Relative %: 80 % — ABNORMAL HIGH (ref 43–77)

## 2010-12-05 ENCOUNTER — Ambulatory Visit (HOSPITAL_COMMUNITY)
Admission: RE | Admit: 2010-12-05 | Discharge: 2010-12-05 | Disposition: A | Discharge status: Home / Self Care | Payer: Medicare HMO | Source: Ambulatory Visit | Attending: Speech Pathology | Admitting: Speech Pathology

## 2010-12-05 ENCOUNTER — Ambulatory Visit (HOSPITAL_COMMUNITY)
Admission: RE | Admit: 2010-12-05 | Discharge: 2010-12-05 | Disposition: A | Discharge status: Home / Self Care | Payer: Medicare HMO | Source: Ambulatory Visit | Attending: Specialist | Admitting: Specialist

## 2010-12-05 DIAGNOSIS — I10 Essential (primary) hypertension: Secondary | ICD-10-CM | POA: Insufficient documentation

## 2010-12-05 DIAGNOSIS — M6281 Muscle weakness (generalized): Secondary | ICD-10-CM | POA: Insufficient documentation

## 2010-12-05 DIAGNOSIS — E119 Type 2 diabetes mellitus without complications: Secondary | ICD-10-CM | POA: Insufficient documentation

## 2010-12-05 DIAGNOSIS — I69919 Unspecified symptoms and signs involving cognitive functions following unspecified cerebrovascular disease: Secondary | ICD-10-CM | POA: Insufficient documentation

## 2010-12-05 DIAGNOSIS — I69922 Dysarthria following unspecified cerebrovascular disease: Secondary | ICD-10-CM | POA: Insufficient documentation

## 2010-12-05 DIAGNOSIS — Z5189 Encounter for other specified aftercare: Secondary | ICD-10-CM | POA: Insufficient documentation

## 2010-12-12 ENCOUNTER — Ambulatory Visit (HOSPITAL_COMMUNITY)
Admission: RE | Admit: 2010-12-12 | Discharge: 2010-12-12 | Disposition: A | Discharge status: Home / Self Care | Payer: Medicare HMO | Source: Ambulatory Visit | Attending: *Deleted | Admitting: *Deleted

## 2010-12-12 LAB — GLUCOSE, CAPILLARY: Glucose-Capillary: 132 mg/dL — ABNORMAL HIGH (ref 70–99)

## 2010-12-13 LAB — BASIC METABOLIC PANEL
Calcium: 9.9 mg/dL (ref 8.4–10.5)
GFR calc Af Amer: >60 mL/min (ref >60)
GFR calc non Af Amer: >60 mL/min (ref >60)
Glucose, Bld: 97 mg/dL (ref 70–99)
Potassium: 3.9 mEq/L (ref 3.5–5.1)
Sodium: 139 mEq/L (ref 135–145)

## 2010-12-13 LAB — GLUCOSE, CAPILLARY: Glucose-Capillary: 131 mg/dL — ABNORMAL HIGH (ref 70–99)

## 2010-12-13 LAB — HEMOGLOBIN AND HEMATOCRIT, BLOOD
HCT: 38.7 % (ref 36.0–46.0)
Hemoglobin: 13.5 g/dL (ref 12.0–15.0)

## 2010-12-15 ENCOUNTER — Ambulatory Visit (HOSPITAL_COMMUNITY)
Admission: RE | Admit: 2010-12-15 | Discharge: 2010-12-15 | Disposition: A | Discharge status: Home / Self Care | Payer: Medicare HMO | Source: Ambulatory Visit | Admitting: Specialist

## 2010-12-17 NOTE — Op Note (Signed)
[name], [name]          ACCOUNT NO.:  [id number]  MEDICAL RECORD NO.:  [id number]           PATIENT TYPE:  O  LOCATION:  DAYP                          FACILITY:  [facility]  PHYSICIAN:  Lionel December, M.D.    DATE OF BIRTH:  01-04-1931  DATE OF PROCEDURE:  11/23/2010 DATE OF DISCHARGE:                              OPERATIVE REPORT   PROCEDURE:  Esophagogastroduodenoscopy followed by colonoscopy with snare polypectomy.  INDICATIONS:  Ms. Couts is 77 year old Caucasian female who presents with epigastric pain, bloating, and postprandial nausea.  She had been on Motrin and Fosamax which have been discontinued.  She is having less pain, but other symptoms persist.  She is also undergoing surveillance colonoscopy since she has history of colonic adenomas, the last exam was in 2006.  Procedures were reviewed with the patient. Informed consent was obtained.  MEDS FOR CONSCIOUS SEDATION:  Cetacaine spray for oropharyngeal topical anesthesia, Demerol 50 mg IV, Versed 8 mg IV in divided dose.  FINDINGS:  Procedures performed in endoscopy suite.  The patient's vital signs and O2 sat were monitored during the procedure and remained stable.  PROCEDURES: 1. Esophagogastroduodenoscopy.  The patient was placed in left lateral     recumbent position and Pentax videoscope was passed via oropharynx     without any difficulty into esophagus. 2. Esophagus.  Mucosa of the esophagus normal.  GE junction was     located at 34 cm from the incisors and was unremarkable. 3. Stomach.  It had some bile in it, but there was no food debris.     Stomach distended very well by insufflation.  Folds of proximal     stomach are normal.  The proximal body along the greater curvature,     there was 3 x 6 mm ulcer covered with speck of blood.  Streaking     around it suggesting was healing.  There was some patchy antral     erythema, but no other ulcers were noted.  Pyloric channel was     patent.   Angularis, fundus, and cardia were also examined by     retroflexion of scope and were normal. 4. Duodenum.  Bulbar mucosa was normal.  Scope was passed to second     part of duodenum where mucosa and folds were normal.  Endoscope was     withdrawn.  The patient prepared for procedure #2.  COLONOSCOPY:  Rectal examination performed.  No abnormality noted on external or digital exam.  Pentax videoscope was placed through rectum and advanced under vision into sigmoid colon and beyond.  Preparation was satisfactory.  Scope was passed into cecum which was identified by appendiceal orifice and ileocecal valve.  There were two small polyps, one to the left of appendiceal orifice, it was ablated via cold biopsy. One on the right side was sessile polyp with irregular margins.  It was elevated with saline injection and snared.  It was about 7-8 mm, however, this was lost.  There was another 4-5 mm polyp at the ascending colon which was ablated via cold biopsy and finally there was 5-7 mm sessile polyp at midtransverse colon which was snared  and retrieved for sludge examination.  Mucosa and rest of the colon was normal.  Rectal mucosa similarly was normal.  Scope was retroflexed to examine anorectal junction and small hemorrhoids noted below the dentate line.  Endoscope was withdrawn.  Withdrawal time was 15 minutes.  The patient tolerated the procedures well.  FINAL DIAGNOSES: 1. Small ulcer at gastric body which appears to be healing.  Suspect it     is secondary to  nonsteroidal antiinflammatory drug therapy, we     will rule out H. pylori infection. 2. Four polyps noted on colonoscopy.  Two were snared, one from the     cecum it was lost, other one was retrieved from the transverse     colon. 3. Two of the smaller polyps were ablated via cold biopsy, one from     the cecum, another one from ascending colon. 4. Small external hemorrhoids.  RECOMMENDATIONS: 1. No aspirin for 10 days.   Increase omeprazole to 20 mg b.i.d. for 8     weeks and thereafter once a day. 2. We will check her H. pylori serology today, and I will be     contacting patient with results of blood test and biopsy.     Lionel December, M.D.     NR/MEDQ  D:  11/23/2010  T:  11/23/2010  Job:  [id number]  cc:   Catalina Pizza, M.D. Fax: [phone]  Electronically Signed by Lionel December M.D. on 12/17/2010 12:55:42 PM

## 2010-12-19 ENCOUNTER — Ambulatory Visit (HOSPITAL_COMMUNITY)
Admission: RE | Admit: 2010-12-19 | Discharge: 2010-12-19 | Disposition: A | Discharge status: Home / Self Care | Payer: Medicare HMO | Source: Ambulatory Visit | Attending: *Deleted | Admitting: *Deleted

## 2010-12-21 ENCOUNTER — Ambulatory Visit (HOSPITAL_COMMUNITY)
Admission: RE | Admit: 2010-12-21 | Discharge: 2010-12-21 | Disposition: A | Discharge status: Home / Self Care | Payer: Medicare HMO | Source: Ambulatory Visit | Attending: *Deleted | Admitting: *Deleted

## 2010-12-26 ENCOUNTER — Ambulatory Visit (HOSPITAL_COMMUNITY)
Admission: RE | Admit: 2010-12-26 | Discharge: 2010-12-26 | Disposition: A | Discharge status: Home / Self Care | Payer: Medicare HMO | Source: Ambulatory Visit | Attending: Specialist | Admitting: Specialist

## 2010-12-26 ENCOUNTER — Ambulatory Visit (HOSPITAL_COMMUNITY): Payer: Medicare HMO | Admitting: Occupational Therapy

## 2010-12-26 DIAGNOSIS — I69919 Unspecified symptoms and signs involving cognitive functions following unspecified cerebrovascular disease: Secondary | ICD-10-CM | POA: Insufficient documentation

## 2010-12-26 DIAGNOSIS — M6281 Muscle weakness (generalized): Secondary | ICD-10-CM | POA: Insufficient documentation

## 2010-12-26 DIAGNOSIS — I69922 Dysarthria following unspecified cerebrovascular disease: Secondary | ICD-10-CM | POA: Insufficient documentation

## 2010-12-26 DIAGNOSIS — I10 Essential (primary) hypertension: Secondary | ICD-10-CM | POA: Insufficient documentation

## 2010-12-26 DIAGNOSIS — E119 Type 2 diabetes mellitus without complications: Secondary | ICD-10-CM | POA: Insufficient documentation

## 2010-12-26 DIAGNOSIS — Z5189 Encounter for other specified aftercare: Secondary | ICD-10-CM | POA: Insufficient documentation

## 2010-12-28 ENCOUNTER — Ambulatory Visit (HOSPITAL_COMMUNITY)
Admission: RE | Admit: 2010-12-28 | Discharge: 2010-12-28 | Disposition: A | Discharge status: Home / Self Care | Payer: Medicare HMO | Source: Ambulatory Visit | Admitting: Specialist

## 2010-12-28 ENCOUNTER — Ambulatory Visit (HOSPITAL_COMMUNITY): Payer: Medicare HMO | Admitting: Specialist

## 2011-01-02 ENCOUNTER — Ambulatory Visit (HOSPITAL_COMMUNITY)
Admission: RE | Admit: 2011-01-02 | Discharge: 2011-01-02 | Disposition: A | Discharge status: Home / Self Care | Payer: Medicare HMO | Source: Ambulatory Visit | Attending: *Deleted | Admitting: *Deleted

## 2011-01-04 ENCOUNTER — Ambulatory Visit (HOSPITAL_COMMUNITY): Payer: Medicare HMO | Admitting: Specialist

## 2011-02-09 NOTE — Op Note (Signed)
NAME:  [name], BARGA                    ACCOUNT NO.:  [id number]   MEDICAL RECORD NO.:  [id number]                   PATIENT TYPE:  INP   LOCATION:  A306                                 FACILITY:  [facility]   PHYSICIAN:  Dirk Dress. Katrinka Blazing, M.D.                DATE OF BIRTH:  [birth year]   DATE OF PROCEDURE:  DATE OF DISCHARGE:                                 OPERATIVE REPORT   PREOPERATIVE DIAGNOSIS:  Cholelithiasis, cholecystitis.   POSTOPERATIVE DIAGNOSIS:  Cholelithiasis, cholecystitis.   PROCEDURE:  Diagnostic laparoscopy with adhesiolysis and open  cholecystectomy.   SURGEON:  Dirk Dress. Katrinka Blazing, M.D.   DESCRIPTION OF PROCEDURE:  Under general anesthesia the patient's abdomen  was prepped and draped in a sterile field.  A supraumbilical midline  incision was made and a Veress needle was inserted uneventfully.  The  abdomen was insufflated with 3 liters of CO2.  Using a Vis-A-Port guide a 10-  mm port was placed uneventfully.  A laparoscope was placed.  There were  extensive adhesions in the right upper quadrant.  The left lobe of the liver  could be visualized, but the right lobe of the liver could not be visualized  nor could the duodenum or distal __________ visual lines.   Under videoscopic guidance a transverse incision was made in the upper  midline and a 10mm port was made without difficulty.  Using hook electrode  extensive adhesions to the anterior abdominal wall were taken down without  difficulty.  This was quite extensive and involved adhesions to the stomach,  duodenum, transverse colon and omentum.  Once this was found, I was able to  see the liver.  The liver edge was plastered with adhesions from all the  surrounding viscera.  Carefully dissection was done bluntly and I did not  feel that I could safely do a dissection without risking danger to  surrounding viscera.  It was, therefore, elected to do an open procedure.   After a new setup was placed the previous  incision was extended in the  subcostal plane on the right side.  The muscles were divided.  The abdomen  was entered.  The dissected adhesions were noted.  Using blunt dissection  more adhesions were taken down from the liver edge.  The gallbladder was  small and fibrotic.  It was found.  Once it was found all the adhesions to  the anterior surface of the gallbladder were taken down sharply.  The  gallbladder was then separated from the infrahepatic bed using  electrocautery.  Dissection was carried down to the cystic artery.  The  cystic artery was dissected close to the gallbladder; clipped with 4 clips  and divided.  Next, the cystic duct was dissected; it was clipped with 5  clips and divided.  The cystic duct remnant was tied with a suture ligature  of 2-0 silk.   Hemostasis on the bed was felt to be adequate.  There was no bleeding.  Irrigation was carried out and no bleeding was noted.  Inspection of the  lysed adhesions was carried out.  There was no bleeding from any of these.  Laparotomy sponges were counted and all were accounted for.  The abdomen was  closed using #0 Prolene on the  anterior and posterior rectus sheath, 2-0 Biosyn in the subcutaneous tissue  and staples on the skin.  The supraumbilical incision was closed with #0  Dexon and staples on the skin.  The patient was awakened from anesthesia  uneventfully, transferred to a bed, and taken to the postanesthetic care  unit for further monitoring.      ___________________________________________                                            Dirk Dress. Katrinka Blazing, M.D.   LCS/MEDQ  D:  01/11/2004  T:  01/12/2004  Job:  [id number]   cc:   Angus G. Renard Matter, M.D.  [address]  [place]  [place] [address]  Fax: [phone]   Lionel December, M.D.  [address]  [place]  [place] [address]  Fax: [phone]

## 2011-02-09 NOTE — H&P (Signed)
NAME:  [name], [name]                    ACCOUNT NO.:  [id number]   MEDICAL RECORD NO.:  [id number]                   PATIENT TYPE:  AMB   LOCATION:  DAY                                  FACILITY:  [facility]   PHYSICIAN:  Jerolyn Shin C. Katrinka Blazing, M.D.                DATE OF BIRTH:  03/25/1931   DATE OF ADMISSION:  DATE OF DISCHARGE:                                HISTORY & PHYSICAL   HISTORY OF PRESENT ILLNESS:  [age]-year-old female with history of  gallbladder disease.  The patient gives a history of having acute  gallbladder disease at age 77.  This was in her postpartum period.  She had  an operative procedure in which she was told that her gallbladder was  removed.  She was given the stones that were removed from the gallbladder  and she states that she had a drain in for quite some time.  She has not had  any symptoms until 4 weeks ago, when she developed nausea, diarrhea,  vomiting and abdominal pain.  Upper abdominal ultrasound revealed gallstones  and an otherwise normal gallbladder.  The patient is scheduled for an  attempt at laparoscopic cholecystectomy.  If this cannot be done, open  cholecystectomy will be done.   PAST HISTORY:  1. She has hypertension.  2. Anemia.  3. Hypercholesterolemia.  4. Gastroesophageal reflux disease.   MEDICATIONS:  1. Vytorin 10/20 daily.  2. Prevacid 30 mg b.i.d.  3. Actonel 35 mg weekly.  4. Iron 1 tablet b.i.d.   SURGERY:  1. Hysterectomy in her 75s.  2. Cholecystectomy at age 77.  3. Removal of skin cancer from right upper nose.   SOCIAL HISTORY:  She is married and retired Production assistant, radio.  She does not  drink, smoke or use drugs.   FAMILY HISTORY:  Family history is positive for diabetes mellitus,  hypertension and stroke.   PHYSICAL EXAMINATION:  VITAL SIGNS:  On exam, blood pressure 165/80, pulse  80, respirations 18.  HEENT:  Unremarkable.  NECK:  Neck is supple.  No JVD or bruit.  CHEST:  Chest clear to auscultation.  HEART:  Regular rate and rhythm without any murmur, gallop or rub.  ABDOMEN:  Large right paramedian scar with a right mid-flank drain site, all  healed.  No tenderness.  Normal bowel sounds.  EXTREMITIES:  No cyanosis, clubbing or edema.  NEUROLOGIC:  No focal motor, sensory or cerebellar deficit.   IMPRESSION:  1. Cholelithiasis/cholecystitis.  2. Hypertension.  3. Anemia.  4. Hyperlipidemia.   PLAN:  The patient is scheduled to have an attempt at laparoscopic  cholecystectomy.  If this cannot be done, we will proceed with open  cholecystectomy.     ___________________________________________  Dirk Dress. Katrinka Blazing, M.D.   LCS/MEDQ  D:  01/11/2004  T:  01/11/2004  Job:  [id number]   cc:   Angus G. Renard Matter, M.D.  [address]  [place]  [place] [address]  Fax: [phone]

## 2011-02-09 NOTE — H&P (Signed)
NAME:  [name], [name] NO.:  [id number]   MEDICAL RECORD NO.:  [id number]                  PATIENT TYPE:   LOCATION:                                       FACILITY:   PHYSICIAN:  Lionel December, M.D.                 DATE OF BIRTH:  08-Nov-1930   DATE OF ADMISSION:  DATE OF DISCHARGE:                                HISTORY & PHYSICAL   REFERRING PHYSICIAN:  Angus G. Renard Matter, M.D.   CHIEF COMPLAINT:  Epigastric pain.   HISTORY OF PRESENT ILLNESS:  [name] is a 77 year old  Caucasian female who presents to our office with complaints of feels like a  fist pressing to her epigastric area.  This has been going on for  approximately the last month.  She reports the pain increases at night and  after meals and the pain is less severe with light meals and when she eats  early in the day.  She is currently taking Prevacid 30 mg daily for the past  year.  She denies any recent changes in medications except for Actonel which  she started today.  She denies any dysphagia, odynophagia, melena or bright  red rectal bleeding.  She has never had a history of peptic ulcer disease.  She denies a history of hemorrhoids.  She reports her bowel movements are  daily up to b.i.d., soft and brown.  Her weight has significantly decreased  in the last month 8 pounds.  She denies any fever or chills.  Her appetite  has also decreased secondary to pain.  She is being referred by Dr. Renard Matter  for evaluation of dyspepsia.  On June 07, 2003, a CBC was obtained  which was normal, metabolic panel which was normal except for elevated  glucose, and liver function tests were normal.   PAST MEDICAL HISTORY:  1. Hypercholesterolemia.  2. Diabetes mellitus.  3. Cholecystectomy (1954).  4. Hysterectomy.  5. Seasonal allergies.  6. Sciatica.   CURRENT MEDICATIONS:  1. Prevacid 30 mg daily.  2. Lipitor 20 mg daily.  3. Claritin D 10 mg every day p.r.n.  4.  Actonel 35 mg every week.   ALLERGIES:  1. CODEINE.  2. ASPIRIN.   FAMILY HISTORY:  No known family history of colon or rectal carcinoma, liver  problems or chronic GI problems.  Both mother and father are deceased  secondary to CVA.  She had a total of ten siblings, six of which are  deceased, due to non-GI etiologies.   SOCIAL HISTORY:  She has been married for 51 years.  She has three grown  sons who are all healthy except for chronic GERD.  She is currently a  retired Geographical information systems officer.  She has never smoked and she denies any alcohol or  drug use.   REVIEW OF SYSTEMS:  CONSTITUTIONAL:  See HPI.  CARDIOVASCULAR:  She denies  any chest pain or palpitations.  PULMONARY:  She denies any shortness of  breath or cough.  MUSCULOSKELETAL:  She reports occasional lower extremity  leg pain and a history of sciatica with bulging disk. SKIN:  She denies  any rash or jaundice.  ENDOCRINE:  She has been recently worked up by Dr.  Renard Matter for new onset diabetes mellitus.  GU:  She denies any dysuria or  hematuria or increased urinary frequency.  HEENT:  She does report problems  with her seasonal allergies.   PHYSICAL EXAMINATION:  VITAL SIGNS:  Weight 172, height 60 inches, temp  99.0, blood pressure 140/80, pulse 70.  GENERAL:  [name] is a 77 year old Caucasian female who is  overweight.  She is alert and oriented.  She is in no acute distress.  HEENT:  Sclera clear, nonicteric.  Conjunctivae pink.  NECK:  Supple without any masses or thyromegaly.  CHEST:  Heart regular rate and rhythm without murmurs, clicks, rubs or  gallops.  LUNGS:  Clear to auscultation bilaterally.  ABDOMEN:  Rounded and protuberant.  Positive for striae especially in the  left lower quadrant.  There are also some well healed cholecystectomy scars.  Nondistended, nontender, no organomegaly.  EXTREMITIES:  Pedal pulses 2+ bilaterally.  No pedal edema.  SKIN:  Pink, warm and dry without any rash or  jaundice.   ASSESSMENT:  [name] is a 77 year old Caucasian female with  dyspepsia.  Given her history of chronic gastroesophageal reflux disease  symptoms and recent exacerbation of symptoms of over the past month, given  Prevacid 30 mg daily, complemented by her 8 pound weight loss in the past  month, I feel it would be beneficial to proceed with an  esophagogastroduodenoscopy for further evaluation, to evaluate peptic ulcer  disease, versus reflux esophagitis, versus gastroparesis as well as evaluate  for Barrett's esophagus.  I have discussed this procedure with [name] to includes risks and benefits but not limited to bleeding,  perforation and infection.  She agrees with this plan.  She should also  continue daily proton pump inhibitor which may have to be increased to  b.i.d.  I also discussed a screening colonoscopy given her age of 65 years.  She has declined this procedure and would like to defer it at this point in  time.   RECOMMENDATIONS:  1. I will refill her Prevacid 30 mg, #60, to take 30 minutes before     breakfast daily which may need to be increased to b.i.d. post EGD.  2. I will schedule an EGD with Dr. Karilyn Cota.  3.     Labs today to include CBC, MET-7, LFTs and hemoccult card x 3.  4. Consider screening colonoscopy in the future.   We would like to thank Dr. Renard Matter for this kind referral.     _____________________________________  ___________________________________________  Nicholas Lose, N.P.               Lionel December, M.D.   KC/MEDQ  D:  06/25/2003  T:  06/25/2003  Job:  [id number]   cc:   Lionel December, M.D.  [address]  [place]  [place] [address]  Fax: [phone]   Angus G. Renard Matter, M.D.  [address]  [place]  [place] [address]  Fax: [phone]

## 2011-02-09 NOTE — Discharge Summary (Signed)
NAME:  [name], [name]                    ACCOUNT NO.:  [id number]   MEDICAL RECORD NO.:  [id number]                   PATIENT TYPE:  INP   LOCATION:  A306                                 FACILITY:  [facility]   PHYSICIAN:  Dirk Dress. Katrinka Blazing, M.D.                DATE OF BIRTH:  Aug 03, 1931   DATE OF ADMISSION:  01/11/2004  DATE OF DISCHARGE:  01/14/2004                                 DISCHARGE SUMMARY   DISCHARGE DIAGNOSES:  1. Cholelithiasis/cholecystitis.  2. Hypertension.  3. Chronic anemia.  4. Hyperlipidemia.   SPECIAL PROCEDURE:  Diagnostic laparoscopy with open cholecystectomy, April  19.   DISPOSITION:  The patient is discharged home in stable satisfactory  condition.   DISCHARGE MEDICATIONS:  1. Ultracet 1 or 2 q.4h. p.r.n. pain.  2. Prevacid 30 mg b.i.d.  3. Mylicon 80 two tablets q.4h. p.r.n. for gas.  4. Vytorin 10/20 one p.o. daily.  5. Actonel 35 mg weekly.  6. Ferrous sulfate 1 tablet b.i.d.   FOLLOWUP:  The patient is scheduled to be seen in the office in 10 days.   SUMMARY:  A 77 year old female with history of gallbladder disease.  The  patient gives a history of having acute gallbladder disease at age 12.  She  had an operative procedure in which she was told that her gallbladder was  removed.  She was given the stones and she states that the area was drained  for quite some time.  About 4 weeks prior to admission, she developed  nausea, diarrhea, vomiting, and abdominal pain.  Upper abdominal ultrasound  revealed gallstones and an otherwise normal gallbladder.  The patient was  scheduled for a cholecystectomy, but was told that we would do a diagnostic  laparoscopy first to evaluate her for adhesions and if the adhesions were  too numerous that we would revert to an open cholecystectomy.  On April 19,  a diagnostic laparoscopy was done.  She had extensive adhesions in the right  lower quadrant with dense adhesions of the stomach, duodenum, transverse  colon, and omentum to the intrahepatic space.  The gallbladder could not be  safely identified laparoscopically, so an open procedure was done.  The  gallbladder was removed uneventfully.  The patient had no problems during  the postoperative period.  She had good return of bowel function and was  discharged home the third postoperative day in satisfactory condition.    ___________________________________________                                         Dirk Dress Katrinka Blazing, M.D.   LCS/MEDQ  D:  01/22/2004  T:  01/22/2004  Job:  [id number]

## 2011-02-09 NOTE — Op Note (Signed)
NAME:  [name], [name]                    ACCOUNT NO.:  [id number]   MEDICAL RECORD NO.:  [id number]                   PATIENT TYPE:  AMB   LOCATION:  DAY                                  FACILITY:  [facility]   PHYSICIAN:  Lionel December, M.D.                 DATE OF BIRTH:  12/10/1930   DATE OF PROCEDURE:  07/09/2003  DATE OF DISCHARGE:                                 OPERATIVE REPORT   PROCEDURE:  Esophagogastroduodenoscopy.   INDICATIONS FOR PROCEDURE:  [name] is a 77 year old Caucasian  female who has GERD who is on Prevacid, although she forgets to take it on a  regular basis, who has recurrent epigastric pain as well as retrosternal  pain.  She has lost about eight pounds involuntarily.  Her CBC and LFTs have  been within normal limits.  We also did three Hemoccults which were  negative.  The patient is undergoing diagnostic esophagogastroduodenoscopy.  She was offered a screening colonoscopy, but she is not quite ready.  The  procedure risks were reviewed with the patient, and informed consent for the  procedure was obtained.   PREOPERATIVE MEDICATIONS:  Cetacaine spray for pharyngeal topical  anesthesia, Demerol 50 mg IV, Versed 5 mg IV in divided doses.   FINDINGS:  The procedure was performed in the endoscopy suite.  The  patient's vital signs and O2 saturations were monitored during the procedure  and remained stable.  The patient was placed in the left lateral recumbent  position, and the Olympus videoscope was passed via the oropharynx without  any difficulty into the esophagus.   Esophagus:  The mucosa of the esophagus was normal throughout.  The  squamocolumnar junction was wavy, and there was a 3-cm size sliding hiatal  hernia.   Stomach:  It was empty and distended very well with insufflation.  The folds  of the proximal stomach were normal.  Examination of  the mucosa at gastric  body, antrum, pyloric channel, as well as angularis, fundus, and cardia  was  normal.   Duodenum:  Examination of the bulb and postbulbar duodenum was normal.  The  folds of the postbulbar duodenum were also normal.  The endoscope was  withdrawn.  The patient tolerated the procedure well.   FINAL DIAGNOSIS:  Small sliding hiatal hernia.  Otherwise, normal  esophagogastroduodenoscopy.    RECOMMENDATIONS:  She will continue antireflux measures and Prevacid at 30  mg p.o. q.a.m.  It is very important that she takes her medication everyday  for [place] to determine whether the treatment is ineffective or not.  Will  schedule her for an upper abdominal ultrasound to review her gallbladder,  bile duct, liver, and pancreas.      ___________________________________________  Lionel December, M.D.   NR/MEDQ  D:  07/09/2003  T:  07/09/2003  Job:  [id number]   cc:   Angus G. Renard Matter, M.D.  [address]  [place]  [place] [address]  Fax: [phone]

## 2011-02-09 NOTE — Op Note (Signed)
NAMEGABRIELE, ZWILLING          ACCOUNT NO.:  [id number]   MEDICAL RECORD NO.:  [id number]          PATIENT TYPE:  AMB   LOCATION:  DAY                           FACILITY:  [facility]   PHYSICIAN:  Lionel December, M.D.    DATE OF BIRTH:  02/27/1931   DATE OF PROCEDURE:  05/02/2005  DATE OF DISCHARGE:                                 OPERATIVE REPORT   PROCEDURE:  Esophagogastroduodenoscopy followed by colonoscopy.   INDICATION:  [name] is a 77 year old Caucasian female with chronic GERD  symptoms that are not well-controlled with double dose PPI. She is also  watching her diet very closely. She also complains of intermittent  epigastric and retrosternal pain postprandially. She has had diarrhea since  her cholecystectomy with some mucus but no blood. She had three Hemoccults  and all three were positive. She is undergoing diagnostic EGD and  colonoscopy. Procedure risks were reviewed with the patient, informed  consent was obtained.   PREMEDICATION:  Cetacaine spray for pharyngeal topical anesthesia, Demerol  50 mg IV, Versed 8 mg IV.   FINDINGS:  Procedure performed in endoscopy suite. The patient's vital signs  and O2 sat were monitored during the procedure and remained stable.   PROCEDURE #1:  Esophagogastroduodenoscopy. The patient was placed in the  left lateral recumbent position. Olympus video scope was passed per  oropharynx without any difficulty into the esophagus. Mucosa of the  esophagus normal. GE junction was wavy, located at 34 cm from the incisors,  but there were no erosions or ulcers noted.   Esophagus. Mucosa of the esophagus normal throughout. GE junction was wavy,  located at 34 cm from the incisors. No hernia was appreciated.   Stomach. It was empty and distended very well with insufflation. Folds of  proximal stomach were normal. Examination of mucosa revealed antral erythema  and granularity but no erosions or ulcers noted. Pyloric channel was patent.  Angularis, fundus and cardia were examined by retroflexion of the scope and  were normal.   Duodenum. Bulbar mucosa was normal. Scope was passed in the second, third  part of duodenum where mucosa was granular and coarse. It did not have a  scalloped appearance. Biopsy was taken to rule out villous atrophy or  mucosal disease. Endoscope was withdrawn and the patient prepared for  procedure #2.   PROCEDURE #2:  Colonoscopy. Rectal examination performed. No abnormality  noted on external or digital exam. Olympus video scope was placed in the  rectum and advanced under vision into sigmoid colon and beyond. Preparation  was satisfactory. There were two polyps at hepatic flexure on the transverse  colon side. These were ablated via cold biopsy and submitted in one  container.   Scope was advanced to the cecum which was identified by the prominent  ileocecal valve and appendiceal orifice. There was another small polyp next  to appendiceal orifice. This was ablated via cold biopsy in piecemeal  fashion. There was a single cecal AVM which was not bleeding and was left  alone. As the scope was withdrawn, colonic mucosa was examined for the  second time and no changes of colitis  were noted in any segment. Rectal  mucosa was normal. Scope was retroflexed to examine anorectal junction and  small hemorrhoids noted below the dentate line. Endoscope was then  withdrawn. The patient tolerated the procedure well.   FINAL DIAGNOSES:  1.  Normal examination of esophagus.  2.  Nonerosive antral gastritis.  3.  Coarse appearance to postbulbar mucosa, biopsy taken to rule out mucosal      disease.  4.  No endoscopic evidence of colitis.  5.  Three small polyps, all of which were ablated via cold biopsy. Two were      at hepatic flexure and one at cecum.  6.  Appendiceal orifice.  7.  A tiny nonbleeding, tiny arteriovenous malformation which was left      alone.  8.  Small external hemorrhoids.    RECOMMENDATIONS:  1.  Will check her CBC and H pylori serology today.  2.  Will stop Prilosec and give her a trial with a Zegerid 40 mg p.o. q.a.m.      She will pick up samples from the office.  3.  Fiber Choice 2 tablets q.d.  4.  I will be contacting the patient with results of blood test and biopsy      and further recommendations.       NR/MEDQ  D:  05/02/2005  T:  05/02/2005  Job:  [id number]   cc:   Angus G. Renard Matter, MD  [address]  [place]  [place] [address]  Fax: [phone]

## 2011-05-23 ENCOUNTER — Other Ambulatory Visit: Payer: Self-pay

## 2011-07-12 ENCOUNTER — Ambulatory Visit (INDEPENDENT_AMBULATORY_CARE_PROVIDER_SITE_OTHER): Payer: Medicare HMO | Admitting: Otolaryngology

## 2011-07-12 DIAGNOSIS — R490 Dysphonia: Secondary | ICD-10-CM

## 2011-07-12 DIAGNOSIS — K219 Gastro-esophageal reflux disease without esophagitis: Secondary | ICD-10-CM

## 2011-08-01 ENCOUNTER — Other Ambulatory Visit (HOSPITAL_COMMUNITY): Payer: Self-pay | Admitting: Internal Medicine

## 2011-08-01 DIAGNOSIS — Z139 Encounter for screening, unspecified: Secondary | ICD-10-CM

## 2011-08-07 ENCOUNTER — Ambulatory Visit (HOSPITAL_COMMUNITY)
Admission: RE | Admit: 2011-08-07 | Discharge: 2011-08-07 | Disposition: A | Discharge status: Home / Self Care | Payer: Medicare HMO | Source: Ambulatory Visit | Attending: Internal Medicine | Admitting: Internal Medicine

## 2011-08-07 DIAGNOSIS — Z139 Encounter for screening, unspecified: Secondary | ICD-10-CM

## 2011-08-07 DIAGNOSIS — Z1231 Encounter for screening mammogram for malignant neoplasm of breast: Secondary | ICD-10-CM | POA: Insufficient documentation

## 2011-08-09 ENCOUNTER — Ambulatory Visit (INDEPENDENT_AMBULATORY_CARE_PROVIDER_SITE_OTHER): Payer: Medicare HMO | Admitting: Otolaryngology

## 2011-08-09 DIAGNOSIS — R490 Dysphonia: Secondary | ICD-10-CM

## 2011-08-20 IMAGING — US US CAROTID DUPLEX BILAT
1 series · 13 of 24 positions shown · non-contrast
Comparison: None.

CLINICAL DATA: Dizziness, hyperlipidemia, diabetes, hypertension

BILATERAL CAROTID DUPLEX ULTRASOUND
TECHNIQUE: Gray scale imaging, color Doppler and duplex ultrasound
was performed of bilateral carotid and vertebral arteries in the
neck.

[Series 1: us carotid duplex bilat · 0.07mm/px · 13 of 68 slices shown]
[im 1/68]
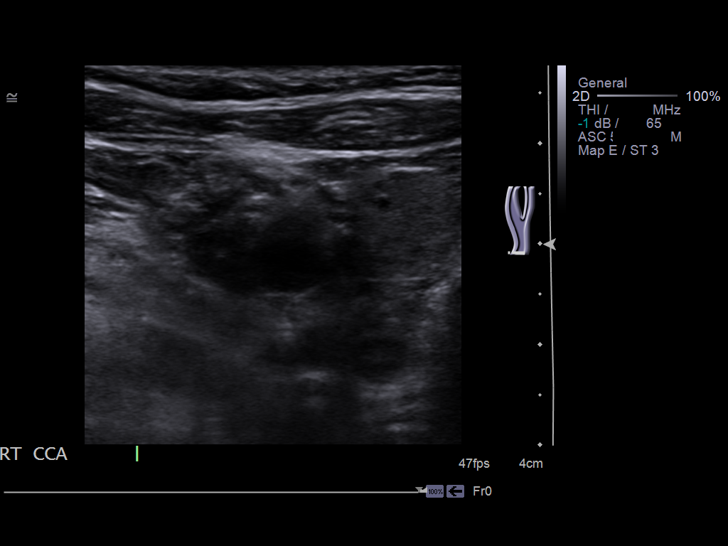
[im 6/68]
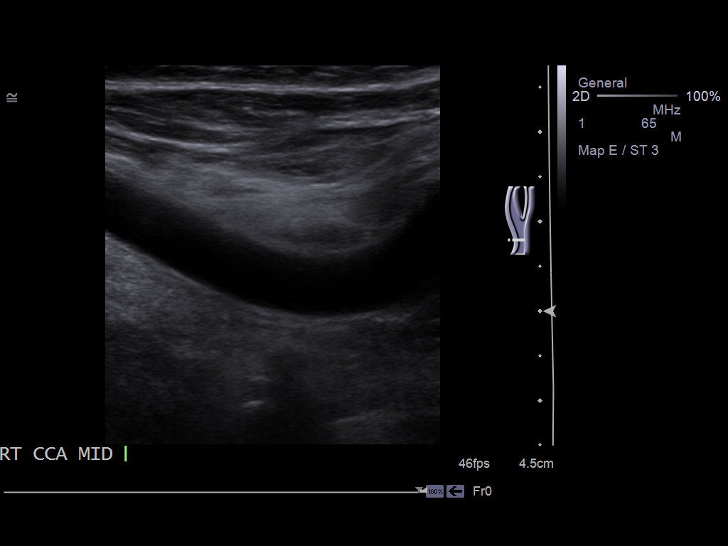
[im 12/68]
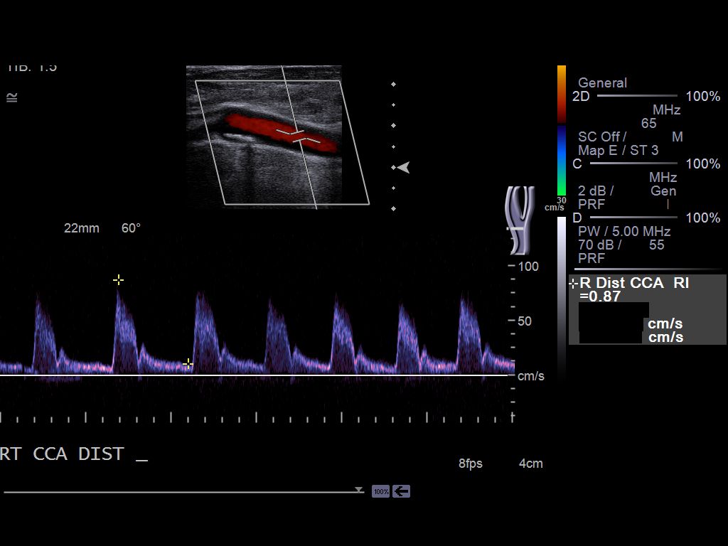
[im 18/68]
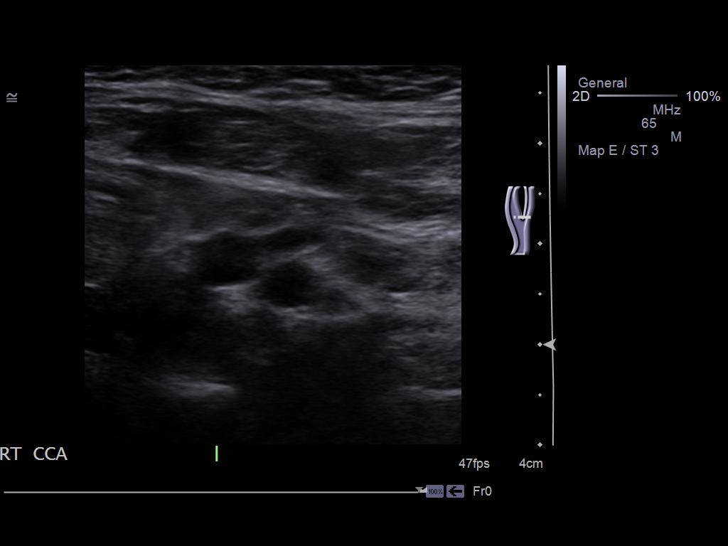
[im 24/68]
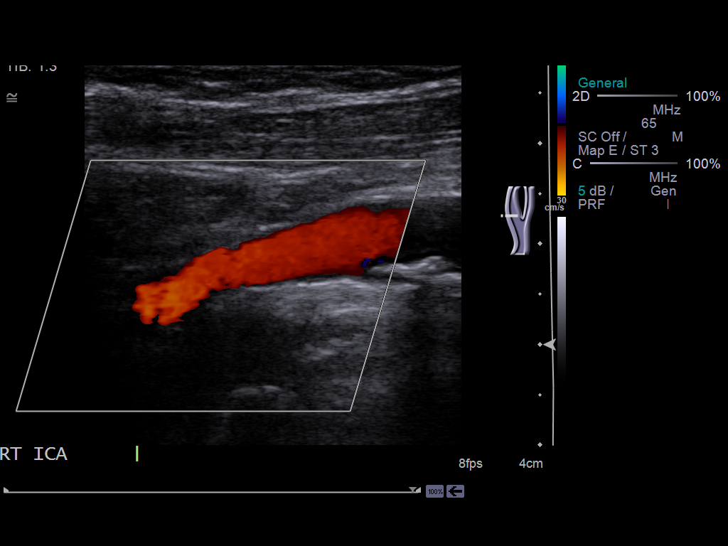
[im 30/68]
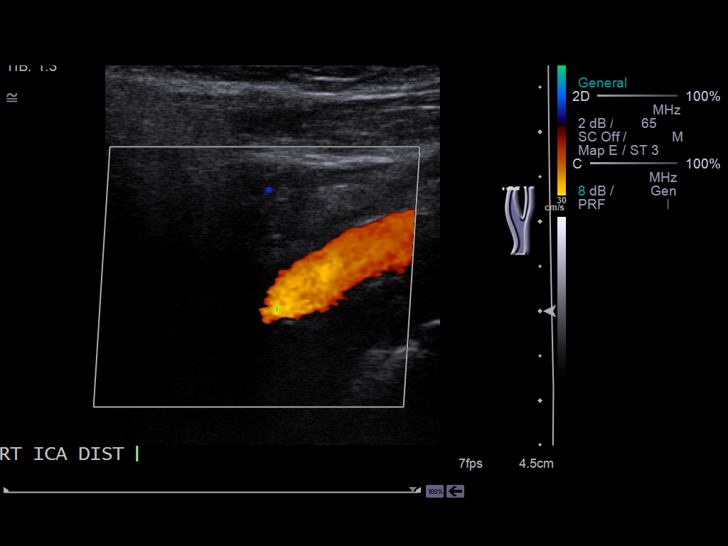
[im 35/68]
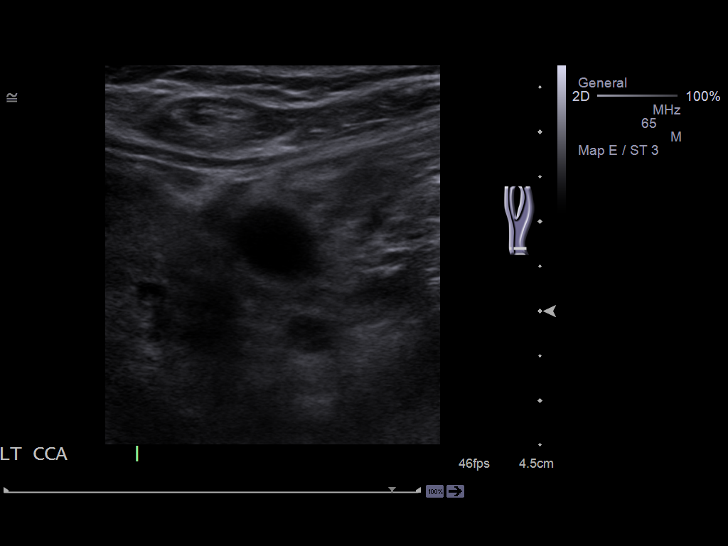
[im 38/68]
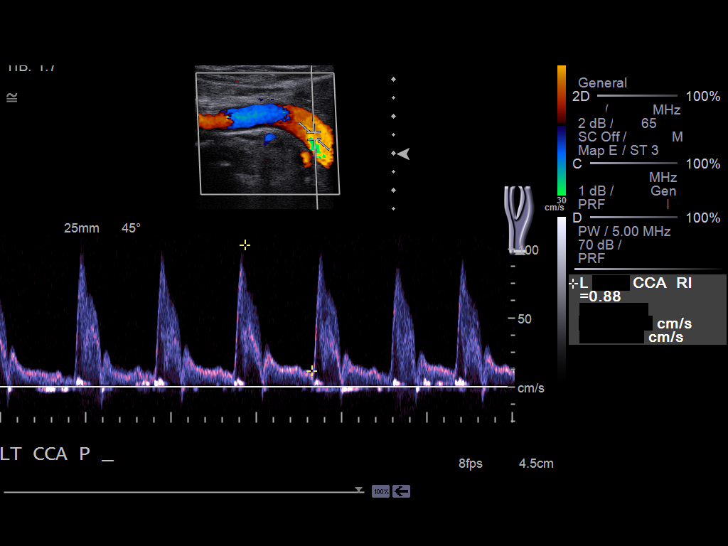
[im 44/68]
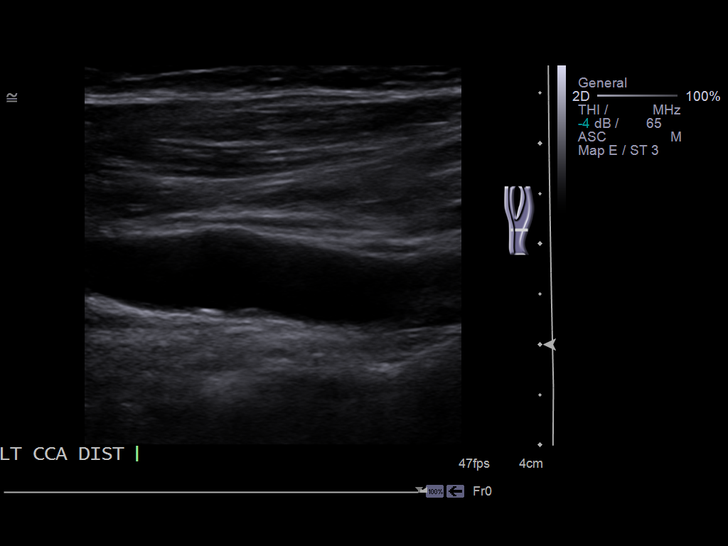
[im 50/68]
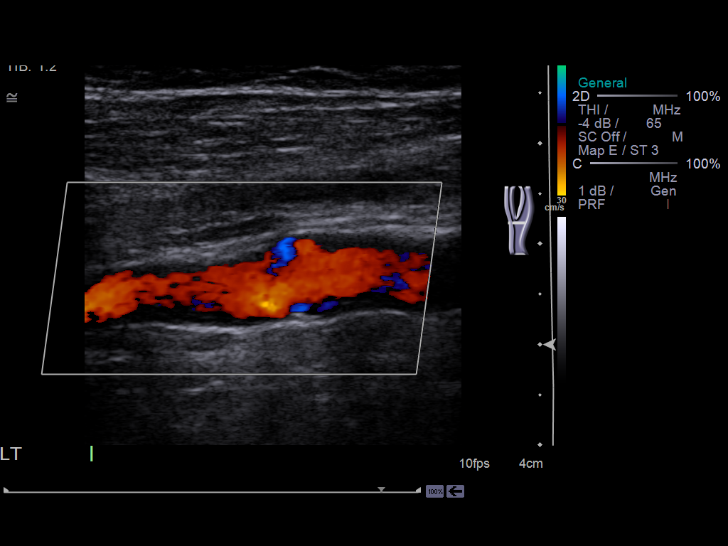
[im 56/68]
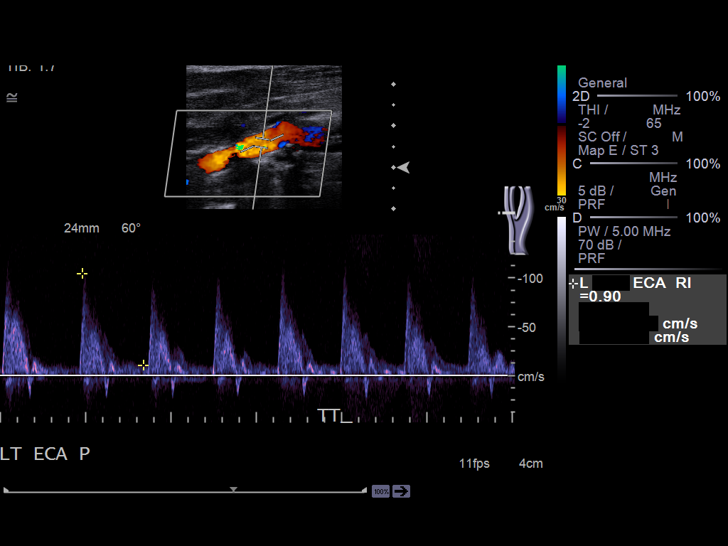
[im 62/68]
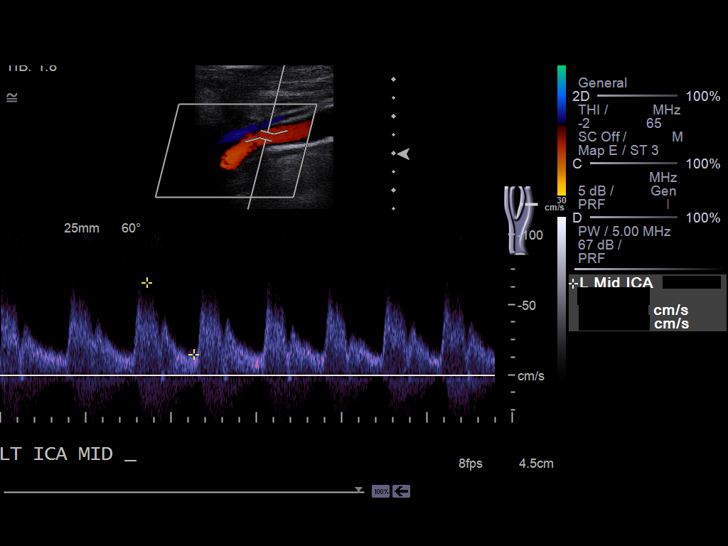
[im 68/68]
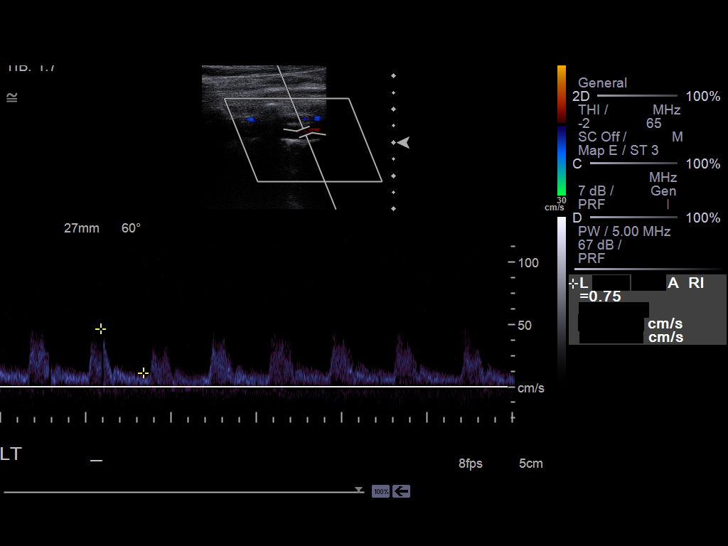

[13 of 24 positions shown; findings below may reference images not displayed]

Criteria:  Quantification of carotid stenosis is based on velocity
parameters that correlate the residual internal carotid diameter
with NASCET-based stenosis levels, using the diameter of the distal
internal carotid lumen as the denominator for stenosis measurement.

The following velocity measurements were obtained:

                 PEAK SYSTOLIC/END DIASTOLIC
RIGHT
ICA:                        95/20cm/sec
CCA:                        100/12cm/sec
SYSTOLIC ICA/CCA RATIO:
DIASTOLIC ICA/CCA RATIO:
ECA:                        121/5cm/sec

LEFT
ICA:                        74/18cm/sec
CCA:                        101/14cm/sec
SYSTOLIC ICA/CCA RATIO:
DIASTOLIC ICA/CCA RATIO:
ECA:                        105/11cm/sec
FINDINGS: RIGHT CAROTID ARTERY: Minimal echogenic right carotid bifurcation
plaque formation.  No hemodynamically significant ICA stenosis,
velocity elevation, or turbulent flow.

RIGHT VERTEBRAL ARTERY:  Antegrade

LEFT CAROTID ARTERY: Very minimal left carotid bifurcation
echogenic plaque formation.  No hemodynamically significant left
ICA stenosis, velocity elevation, or turbulent flow.

LEFT VERTEBRAL ARTERY:  Antegrade
IMPRESSION: Minimal carotid atherosclerosis.  No hemodynamically significant
ICA stenosis on either side.

## 2012-06-30 ENCOUNTER — Other Ambulatory Visit (HOSPITAL_COMMUNITY): Payer: Self-pay | Admitting: Pulmonary Disease

## 2012-06-30 ENCOUNTER — Other Ambulatory Visit (HOSPITAL_COMMUNITY): Payer: Self-pay | Admitting: Internal Medicine

## 2012-06-30 DIAGNOSIS — Z139 Encounter for screening, unspecified: Secondary | ICD-10-CM

## 2012-08-07 ENCOUNTER — Ambulatory Visit (HOSPITAL_COMMUNITY)
Admission: RE | Admit: 2012-08-07 | Discharge: 2012-08-07 | Disposition: A | Discharge status: Home / Self Care | Payer: Medicare Other | Source: Ambulatory Visit | Attending: Pulmonary Disease | Admitting: Pulmonary Disease

## 2012-08-07 DIAGNOSIS — Z139 Encounter for screening, unspecified: Secondary | ICD-10-CM

## 2012-08-07 DIAGNOSIS — Z1231 Encounter for screening mammogram for malignant neoplasm of breast: Secondary | ICD-10-CM | POA: Insufficient documentation

## 2012-08-13 ENCOUNTER — Other Ambulatory Visit (HOSPITAL_COMMUNITY): Payer: Self-pay | Admitting: Pulmonary Disease

## 2012-08-13 ENCOUNTER — Ambulatory Visit (HOSPITAL_COMMUNITY): Admission: RE | Admit: 2012-08-13 | Payer: BC Managed Care – HMO | Source: Ambulatory Visit

## 2012-08-13 ENCOUNTER — Ambulatory Visit (HOSPITAL_COMMUNITY)
Admission: RE | Admit: 2012-08-13 | Discharge: 2012-08-13 | Disposition: A | Discharge status: Home / Self Care | Payer: Medicare Other | Source: Ambulatory Visit | Attending: Pulmonary Disease | Admitting: Pulmonary Disease

## 2012-08-13 ENCOUNTER — Other Ambulatory Visit (HOSPITAL_COMMUNITY): Payer: Self-pay

## 2012-08-13 DIAGNOSIS — M79604 Pain in right leg: Secondary | ICD-10-CM

## 2012-08-13 DIAGNOSIS — M79609 Pain in unspecified limb: Secondary | ICD-10-CM | POA: Insufficient documentation

## 2012-08-13 DIAGNOSIS — R52 Pain, unspecified: Secondary | ICD-10-CM

## 2012-09-25 ENCOUNTER — Ambulatory Visit (INDEPENDENT_AMBULATORY_CARE_PROVIDER_SITE_OTHER): Payer: Medicare Other | Admitting: Otolaryngology

## 2012-09-25 DIAGNOSIS — K219 Gastro-esophageal reflux disease without esophagitis: Secondary | ICD-10-CM

## 2012-09-25 DIAGNOSIS — R490 Dysphonia: Secondary | ICD-10-CM

## 2012-10-23 ENCOUNTER — Ambulatory Visit (INDEPENDENT_AMBULATORY_CARE_PROVIDER_SITE_OTHER): Payer: Medicare Other | Admitting: Otolaryngology

## 2012-10-23 DIAGNOSIS — R490 Dysphonia: Secondary | ICD-10-CM

## 2012-10-23 DIAGNOSIS — K219 Gastro-esophageal reflux disease without esophagitis: Secondary | ICD-10-CM

## 2012-11-11 ENCOUNTER — Telehealth: Payer: Self-pay | Admitting: Orthopedic Surgery

## 2012-11-11 NOTE — Telephone Encounter (Signed)
i dont need to see her for esi orders   Dr Eduard Clos and [facility] are our referral places for [facility]

## 2012-11-11 NOTE — Telephone Encounter (Signed)
Patient called to ask about scheduling an appointment with Dr. Romeo Apple "to get another referral" for epidural injection.  She states her back pain is getting worse, and radiating to knees.  Her last visit here was November of 2010, at which time, MRI of lumbar spine was ordered.  ESI's of L-4 and L-5 were done,x2.  Note: Patient has changed primary care doctors - states now sees Dr. Juanetta Gosling.  I had asked her if she has had any other treatment or studies done since Dr. Romeo Apple had last seen her by her primary care.  States she was referred by Dr. Juanetta Gosling to someone in [place] -- she found the physician's card - it was Dr. Thyra Breed, [facility], whom she said "she was not too happy with."  Please advise if patient is to follow-up with the pain management office, or with primary care, or here, as she requests?  Patient ph# [phone].

## 2012-11-12 ENCOUNTER — Other Ambulatory Visit: Payer: Self-pay | Admitting: *Deleted

## 2012-11-12 ENCOUNTER — Telehealth: Payer: Self-pay | Admitting: *Deleted

## 2012-11-12 NOTE — Telephone Encounter (Signed)
Faxed referral and office notes to [facility]. Awaiting appointment.

## 2012-11-12 NOTE — Telephone Encounter (Signed)
Called back to patient regarding Dr. Mort Sawyers response, and forwarding to nurse regarding referral for ESI.

## 2012-11-13 ENCOUNTER — Other Ambulatory Visit: Payer: Self-pay | Admitting: Orthopedic Surgery

## 2012-11-13 DIAGNOSIS — M479 Spondylosis, unspecified: Secondary | ICD-10-CM

## 2012-11-17 ENCOUNTER — Ambulatory Visit
Admission: RE | Admit: 2012-11-17 | Discharge: 2012-11-17 | Disposition: A | Discharge status: Home / Self Care | Payer: Medicare Other | Source: Ambulatory Visit | Attending: Orthopedic Surgery | Admitting: Orthopedic Surgery

## 2012-11-17 ENCOUNTER — Other Ambulatory Visit: Payer: Self-pay | Admitting: Orthopedic Surgery

## 2012-11-17 DIAGNOSIS — M479 Spondylosis, unspecified: Secondary | ICD-10-CM

## 2012-11-17 MED ORDER — IOHEXOL 180 MG/ML  SOLN
1.0000 mL | Freq: Once | INTRAMUSCULAR | Status: AC | PRN
  Administered 2012-11-17: 1 mL via EPIDURAL

## 2012-11-17 MED ORDER — METHYLPREDNISOLONE ACETATE 40 MG/ML INJ SUSP (RADIOLOG
120.0000 mg | Freq: Once | INTRAMUSCULAR | Status: AC
  Administered 2012-11-17: 120 mg via EPIDURAL

## 2012-11-27 NOTE — Telephone Encounter (Signed)
Sent referral for ESI injections

## 2013-01-06 ENCOUNTER — Telehealth: Payer: Self-pay | Admitting: Orthopedic Surgery

## 2013-01-06 ENCOUNTER — Other Ambulatory Visit: Payer: Self-pay | Admitting: Orthopedic Surgery

## 2013-01-06 DIAGNOSIS — M549 Dorsalgia, unspecified: Secondary | ICD-10-CM

## 2013-01-06 NOTE — Telephone Encounter (Signed)
Faxed order to [facility]. 

## 2013-01-06 NOTE — Telephone Encounter (Signed)
Patient called to request another epidural steroid injection if she may have it again, at [facility]?

## 2013-01-07 ENCOUNTER — Ambulatory Visit
Admission: RE | Admit: 2013-01-07 | Discharge: 2013-01-07 | Disposition: A | Discharge status: Home / Self Care | Payer: Medicare Other | Source: Ambulatory Visit | Attending: Orthopedic Surgery | Admitting: Orthopedic Surgery

## 2013-01-07 VITALS — BP 146/74 | HR 86

## 2013-01-07 DIAGNOSIS — M549 Dorsalgia, unspecified: Secondary | ICD-10-CM

## 2013-01-07 MED ORDER — IOHEXOL 180 MG/ML  SOLN
1.0000 mL | Freq: Once | INTRAMUSCULAR | Status: AC | PRN
  Administered 2013-01-07: 1 mL via EPIDURAL

## 2013-01-07 MED ORDER — METHYLPREDNISOLONE ACETATE 40 MG/ML INJ SUSP (RADIOLOG
120.0000 mg | Freq: Once | INTRAMUSCULAR | Status: AC
  Administered 2013-01-07: 120 mg via EPIDURAL

## 2013-01-12 ENCOUNTER — Other Ambulatory Visit: Payer: Self-pay | Admitting: Orthopedic Surgery

## 2013-01-12 DIAGNOSIS — M549 Dorsalgia, unspecified: Secondary | ICD-10-CM

## 2013-01-23 ENCOUNTER — Ambulatory Visit
Admission: RE | Admit: 2013-01-23 | Discharge: 2013-01-23 | Disposition: A | Discharge status: Home / Self Care | Payer: Medicare Other | Source: Ambulatory Visit | Attending: Orthopedic Surgery | Admitting: Orthopedic Surgery

## 2013-01-23 ENCOUNTER — Other Ambulatory Visit: Payer: Self-pay | Admitting: Orthopedic Surgery

## 2013-01-23 DIAGNOSIS — M549 Dorsalgia, unspecified: Secondary | ICD-10-CM

## 2013-01-23 MED ORDER — IOHEXOL 180 MG/ML  SOLN
1.0000 mL | Freq: Once | INTRAMUSCULAR | Status: AC | PRN
  Administered 2013-01-23: 1 mL via EPIDURAL

## 2013-01-23 MED ORDER — METHYLPREDNISOLONE ACETATE 40 MG/ML INJ SUSP (RADIOLOG
120.0000 mg | Freq: Once | INTRAMUSCULAR | Status: AC
  Administered 2013-01-23: 120 mg via EPIDURAL

## 2013-07-09 ENCOUNTER — Other Ambulatory Visit (HOSPITAL_COMMUNITY): Payer: Self-pay | Admitting: Pulmonary Disease

## 2013-07-09 DIAGNOSIS — Z139 Encounter for screening, unspecified: Secondary | ICD-10-CM

## 2013-08-11 ENCOUNTER — Ambulatory Visit (HOSPITAL_COMMUNITY)
Admission: RE | Admit: 2013-08-11 | Discharge: 2013-08-11 | Disposition: A | Discharge status: Home / Self Care | Payer: Medicare Other | Source: Ambulatory Visit | Attending: Pulmonary Disease | Admitting: Pulmonary Disease

## 2013-08-11 DIAGNOSIS — Z1231 Encounter for screening mammogram for malignant neoplasm of breast: Secondary | ICD-10-CM | POA: Insufficient documentation

## 2013-08-11 DIAGNOSIS — Z139 Encounter for screening, unspecified: Secondary | ICD-10-CM

## 2014-01-12 ENCOUNTER — Other Ambulatory Visit: Payer: Self-pay | Admitting: Pulmonary Disease

## 2014-01-12 DIAGNOSIS — M545 Low back pain, unspecified: Secondary | ICD-10-CM

## 2014-01-13 ENCOUNTER — Ambulatory Visit
Admission: RE | Admit: 2014-01-13 | Discharge: 2014-01-13 | Disposition: A | Discharge status: Home / Self Care | Payer: Medicare Other | Source: Ambulatory Visit | Attending: Pulmonary Disease | Admitting: Pulmonary Disease

## 2014-01-13 VITALS — BP 147/68 | HR 75

## 2014-01-13 DIAGNOSIS — M545 Low back pain, unspecified: Secondary | ICD-10-CM

## 2014-01-13 MED ORDER — IOHEXOL 180 MG/ML  SOLN
1.0000 mL | Freq: Once | INTRAMUSCULAR | Status: AC | PRN
  Administered 2014-01-13: 1 mL via EPIDURAL

## 2014-01-13 MED ORDER — METHYLPREDNISOLONE ACETATE 40 MG/ML INJ SUSP (RADIOLOG
120.0000 mg | Freq: Once | INTRAMUSCULAR | Status: AC
  Administered 2014-01-13: 120 mg via EPIDURAL

## 2014-01-13 NOTE — Discharge Instructions (Signed)

## 2014-02-10 ENCOUNTER — Other Ambulatory Visit (HOSPITAL_COMMUNITY): Payer: Self-pay | Admitting: Pulmonary Disease

## 2014-02-10 DIAGNOSIS — M545 Low back pain, unspecified: Secondary | ICD-10-CM

## 2014-02-12 ENCOUNTER — Ambulatory Visit (HOSPITAL_COMMUNITY)
Admission: RE | Admit: 2014-02-12 | Discharge: 2014-02-12 | Disposition: A | Discharge status: Home / Self Care | Payer: Medicare Other | Source: Ambulatory Visit | Attending: Pulmonary Disease | Admitting: Pulmonary Disease

## 2014-02-12 DIAGNOSIS — M545 Low back pain, unspecified: Secondary | ICD-10-CM

## 2014-02-12 DIAGNOSIS — M48061 Spinal stenosis, lumbar region without neurogenic claudication: Secondary | ICD-10-CM | POA: Insufficient documentation

## 2014-03-03 ENCOUNTER — Other Ambulatory Visit: Payer: Self-pay | Admitting: Pulmonary Disease

## 2014-03-03 DIAGNOSIS — M549 Dorsalgia, unspecified: Secondary | ICD-10-CM

## 2014-03-04 ENCOUNTER — Ambulatory Visit
Admission: RE | Admit: 2014-03-04 | Discharge: 2014-03-04 | Disposition: A | Discharge status: Home / Self Care | Payer: Medicare Other | Source: Ambulatory Visit | Attending: Pulmonary Disease | Admitting: Pulmonary Disease

## 2014-03-04 ENCOUNTER — Other Ambulatory Visit: Payer: Self-pay | Admitting: Pulmonary Disease

## 2014-03-04 DIAGNOSIS — M549 Dorsalgia, unspecified: Secondary | ICD-10-CM

## 2014-03-04 MED ORDER — METHYLPREDNISOLONE ACETATE 40 MG/ML INJ SUSP (RADIOLOG
120.0000 mg | Freq: Once | INTRAMUSCULAR | Status: AC
  Administered 2014-03-04: 120 mg via EPIDURAL

## 2014-03-04 MED ORDER — IOHEXOL 180 MG/ML  SOLN
1.0000 mL | Freq: Once | INTRAMUSCULAR | Status: AC | PRN
  Administered 2014-03-04: 1 mL via EPIDURAL

## 2014-05-13 ENCOUNTER — Other Ambulatory Visit: Payer: Self-pay | Admitting: Pulmonary Disease

## 2014-05-13 DIAGNOSIS — M545 Low back pain, unspecified: Secondary | ICD-10-CM

## 2014-05-13 DIAGNOSIS — G8929 Other chronic pain: Secondary | ICD-10-CM

## 2014-05-17 ENCOUNTER — Ambulatory Visit
Admission: RE | Admit: 2014-05-17 | Discharge: 2014-05-17 | Disposition: A | Discharge status: Home / Self Care | Payer: Medicare Other | Source: Ambulatory Visit | Attending: Pulmonary Disease | Admitting: Pulmonary Disease

## 2014-05-17 DIAGNOSIS — M545 Low back pain: Principal | ICD-10-CM

## 2014-05-17 DIAGNOSIS — G8929 Other chronic pain: Secondary | ICD-10-CM

## 2014-05-17 MED ORDER — METHYLPREDNISOLONE ACETATE 40 MG/ML INJ SUSP (RADIOLOG
120.0000 mg | Freq: Once | INTRAMUSCULAR | Status: AC
  Administered 2014-05-17: 120 mg via EPIDURAL

## 2014-05-17 MED ORDER — IOHEXOL 180 MG/ML  SOLN
1.0000 mL | Freq: Once | INTRAMUSCULAR | Status: AC | PRN
  Administered 2014-05-17: 1 mL via EPIDURAL

## 2014-05-17 NOTE — Discharge Instructions (Signed)

## 2015-09-27 DIAGNOSIS — E1165 Type 2 diabetes mellitus with hyperglycemia: Secondary | ICD-10-CM | POA: Diagnosis not present

## 2015-09-27 DIAGNOSIS — I10 Essential (primary) hypertension: Secondary | ICD-10-CM | POA: Diagnosis not present

## 2015-09-27 DIAGNOSIS — R079 Chest pain, unspecified: Secondary | ICD-10-CM | POA: Diagnosis not present

## 2015-09-27 DIAGNOSIS — M545 Low back pain: Secondary | ICD-10-CM | POA: Diagnosis not present

## 2015-09-30 ENCOUNTER — Ambulatory Visit (INDEPENDENT_AMBULATORY_CARE_PROVIDER_SITE_OTHER): Payer: PPO | Admitting: Cardiology

## 2015-09-30 ENCOUNTER — Encounter: Payer: Self-pay | Admitting: Cardiology

## 2015-09-30 VITALS — BP 136/74 | HR 92 | Ht 60.0 in | Wt 146.0 lb

## 2015-09-30 DIAGNOSIS — R072 Precordial pain: Secondary | ICD-10-CM | POA: Diagnosis not present

## 2015-09-30 DIAGNOSIS — R9431 Abnormal electrocardiogram [ECG] [EKG]: Secondary | ICD-10-CM | POA: Diagnosis not present

## 2015-09-30 NOTE — Progress Notes (Signed)
Patient ID: BETHA WIGREN, female   DOB: November 22, 1930, 82 y.o.   MRN: [id number]       Clinical Summary Ms. Tancredi is a 82 y.o.female seen today as a new patient for the following medical problems. She is referred by Dr Velvet Bathe.   1. Chest pain  - started about 1-2 weeks ago. Dull pain 3-4/10, goes from midchest under left breast  into left upper back/scapula. Denies any associated SOB, N/V, or diaphoresis. She had an isolated episode of palpitations a few days ago that was not related to her pain.  - she feels like pain tends to come on after eating. Notes some frequent belching, which can improve symptoms. Symptoms can be worst with position as well.  - better with tylenol and zantac   Past Medical History 1. DM2 2. Hypertension 3. GERD    No Known Allergies   Current Outpatient Prescriptions  Medication Sig Dispense Refill  . ALPRAZolam (XANAX) 0.25 MG tablet Take 0.25 mg by mouth.    Marland Kitchen aspirin EC 81 MG tablet Take 162 mg by mouth.    Marland Kitchen glipiZIDE (GLUCOTROL XL) 2.5 MG 24 hr tablet Take 2.5 mg by mouth.    . losartan-hydrochlorothiazide (HYZAAR) 50-12.5 MG tablet Take 1 tablet by mouth.    Marland Kitchen omeprazole (PRILOSEC) 20 MG capsule Take 20 mg by mouth.    . ranitidine (ZANTAC) 150 MG capsule Take 150 mg by mouth.    . TRADJENTA 5 MG TABS tablet Take 5 mg by mouth daily.  0  . traMADol (ULTRAM) 50 MG tablet Take 50 mg by mouth.     No current facility-administered medications for this visit.       No Known Allergies    Family History She denies any family history of heart disease.    Social History Ms. Meda reports that she has never smoked. She has never used smokeless tobacco. Ms. Rehl has no alcohol history on file.   Review of Systems CONSTITUTIONAL: No weight loss, fever, chills, weakness or fatigue.  HEENT: Eyes: No visual loss, blurred vision, double vision or yellow sclerae.No hearing loss, sneezing, congestion, runny nose or  sore throat.  SKIN: No rash or itching.  CARDIOVASCULAR: per hpi RESPIRATORY: No shortness of breath, cough or sputum.  GASTROINTESTINAL: No anorexia, nausea, vomiting or diarrhea. No abdominal pain or blood.  GENITOURINARY: No burning on urination, no polyuria NEUROLOGICAL: No headache, dizziness, syncope, paralysis, ataxia, numbness or tingling in the extremities. No change in bowel or bladder control.  MUSCULOSKELETAL: No muscle, back pain, joint pain or stiffness.  LYMPHATICS: No enlarged nodes. No history of splenectomy.  PSYCHIATRIC: No history of depression or anxiety.  ENDOCRINOLOGIC: No reports of sweating, cold or heat intolerance. No polyuria or polydipsia.  Marland Kitchen   Physical Examination Filed Vitals:   09/30/15 0848  BP: 136/74  Pulse: 92   Filed Weights   09/30/15 0848  Weight: 146 lb (66.225 kg)    Gen: resting comfortably, no acute distress HEENT: no scleral icterus, pupils equal round and reactive, no palptable cervical adenopathy,  CV: per HPI Resp: Clear to auscultation bilaterally GI: abdomen is soft, non-tender, non-distended, normal bowel sounds, no hepatosplenomegaly MSK: extremities are warm, no edema.  Skin: warm, no rash Neuro:  no focal deficits Psych: appropriate affect   Diagnostic Studies EKG (reviwed in clinic): SR, inferior Q waves and poor R wave progression , possibly related to LAFB.     Assessment and Plan  1. Chest pain - atypical  for cardiac chest pain. Her EKG is abnormal, with possible infeiror infarct with poor R wave progression. This may be explained by LAFB but cannot rule out prior ischemia. - will obtain echo    F/u 3-4 weeks    Arnoldo Lenis, M.D.

## 2015-09-30 NOTE — Patient Instructions (Signed)
Your physician recommends that you schedule a follow-up appointment in: 3-4 weeks   Your physician recommends that you continue on your current medications as directed. Please refer to the Current Medication list given to you today.   If you need a refill on your cardiac medications before your next appointment, please call your pharmacy.    Your physician has requested that you have an echocardiogram. Echocardiography is a painless test that uses sound waves to create images of your heart. It provides your doctor with information about the size and shape of your heart and how well your heart's chambers and valves are working. This procedure takes approximately one hour. There are no restrictions for this procedure.      Thank you for choosing [facility] !

## 2015-10-06 ENCOUNTER — Ambulatory Visit (HOSPITAL_COMMUNITY)
Admission: RE | Admit: 2015-10-06 | Discharge: 2015-10-06 | Disposition: A | Discharge status: Home / Self Care | Payer: PPO | Source: Ambulatory Visit | Attending: Cardiology | Admitting: Cardiology

## 2015-10-06 DIAGNOSIS — R072 Precordial pain: Secondary | ICD-10-CM | POA: Diagnosis not present

## 2015-10-06 DIAGNOSIS — E785 Hyperlipidemia, unspecified: Secondary | ICD-10-CM | POA: Diagnosis not present

## 2015-10-06 DIAGNOSIS — E119 Type 2 diabetes mellitus without complications: Secondary | ICD-10-CM | POA: Insufficient documentation

## 2015-10-06 DIAGNOSIS — I10 Essential (primary) hypertension: Secondary | ICD-10-CM | POA: Diagnosis not present

## 2015-10-21 ENCOUNTER — Ambulatory Visit: Payer: PPO | Admitting: Adult Health

## 2015-10-24 DIAGNOSIS — J209 Acute bronchitis, unspecified: Secondary | ICD-10-CM | POA: Diagnosis not present

## 2015-10-24 DIAGNOSIS — I10 Essential (primary) hypertension: Secondary | ICD-10-CM | POA: Diagnosis not present

## 2015-10-24 DIAGNOSIS — E1165 Type 2 diabetes mellitus with hyperglycemia: Secondary | ICD-10-CM | POA: Diagnosis not present

## 2016-02-22 ENCOUNTER — Other Ambulatory Visit (HOSPITAL_COMMUNITY): Payer: Self-pay | Admitting: Pulmonary Disease

## 2016-02-22 ENCOUNTER — Ambulatory Visit (HOSPITAL_COMMUNITY)
Admission: RE | Admit: 2016-02-22 | Discharge: 2016-02-22 | Disposition: A | Discharge status: Home / Self Care | Payer: PPO | Source: Ambulatory Visit | Attending: Pulmonary Disease | Admitting: Pulmonary Disease

## 2016-02-22 DIAGNOSIS — R059 Cough, unspecified: Secondary | ICD-10-CM

## 2016-02-22 DIAGNOSIS — R05 Cough: Secondary | ICD-10-CM | POA: Diagnosis not present

## 2016-02-22 DIAGNOSIS — M545 Low back pain: Secondary | ICD-10-CM | POA: Diagnosis not present

## 2016-02-22 DIAGNOSIS — M778 Other enthesopathies, not elsewhere classified: Secondary | ICD-10-CM | POA: Diagnosis not present

## 2016-02-22 DIAGNOSIS — E1165 Type 2 diabetes mellitus with hyperglycemia: Secondary | ICD-10-CM | POA: Diagnosis not present

## 2016-02-22 DIAGNOSIS — M542 Cervicalgia: Secondary | ICD-10-CM

## 2016-05-24 ENCOUNTER — Other Ambulatory Visit (HOSPITAL_COMMUNITY): Payer: Self-pay | Admitting: Pulmonary Disease

## 2016-05-24 DIAGNOSIS — Z780 Asymptomatic menopausal state: Secondary | ICD-10-CM

## 2016-05-24 DIAGNOSIS — Z0000 Encounter for general adult medical examination without abnormal findings: Secondary | ICD-10-CM | POA: Diagnosis not present

## 2016-05-29 ENCOUNTER — Other Ambulatory Visit (HOSPITAL_COMMUNITY): Payer: PPO

## 2016-06-01 ENCOUNTER — Ambulatory Visit (HOSPITAL_COMMUNITY)
Admission: RE | Admit: 2016-06-01 | Discharge: 2016-06-01 | Disposition: A | Discharge status: Home / Self Care | Payer: PPO | Source: Ambulatory Visit | Attending: Pulmonary Disease | Admitting: Pulmonary Disease

## 2016-06-01 ENCOUNTER — Other Ambulatory Visit: Payer: PPO

## 2016-06-01 DIAGNOSIS — N951 Menopausal and female climacteric states: Secondary | ICD-10-CM | POA: Diagnosis not present

## 2016-06-01 DIAGNOSIS — M85861 Other specified disorders of bone density and structure, right lower leg: Secondary | ICD-10-CM | POA: Diagnosis not present

## 2016-06-01 DIAGNOSIS — Z780 Asymptomatic menopausal state: Secondary | ICD-10-CM

## 2016-06-01 DIAGNOSIS — M8589 Other specified disorders of bone density and structure, multiple sites: Secondary | ICD-10-CM | POA: Insufficient documentation

## 2016-06-06 DIAGNOSIS — Z1211 Encounter for screening for malignant neoplasm of colon: Secondary | ICD-10-CM | POA: Diagnosis not present

## 2016-07-09 DIAGNOSIS — Z23 Encounter for immunization: Secondary | ICD-10-CM | POA: Diagnosis not present

## 2016-07-19 ENCOUNTER — Other Ambulatory Visit (HOSPITAL_COMMUNITY): Payer: Self-pay | Admitting: Pulmonary Disease

## 2016-07-19 DIAGNOSIS — Z1231 Encounter for screening mammogram for malignant neoplasm of breast: Secondary | ICD-10-CM

## 2016-07-23 DIAGNOSIS — Z961 Presence of intraocular lens: Secondary | ICD-10-CM | POA: Diagnosis not present

## 2016-07-23 DIAGNOSIS — E119 Type 2 diabetes mellitus without complications: Secondary | ICD-10-CM | POA: Diagnosis not present

## 2016-07-23 DIAGNOSIS — H43813 Vitreous degeneration, bilateral: Secondary | ICD-10-CM | POA: Diagnosis not present

## 2016-07-23 DIAGNOSIS — Z9849 Cataract extraction status, unspecified eye: Secondary | ICD-10-CM | POA: Diagnosis not present

## 2016-08-02 DIAGNOSIS — E119 Type 2 diabetes mellitus without complications: Secondary | ICD-10-CM | POA: Diagnosis not present

## 2016-08-02 DIAGNOSIS — I872 Venous insufficiency (chronic) (peripheral): Secondary | ICD-10-CM | POA: Diagnosis not present

## 2016-08-13 ENCOUNTER — Ambulatory Visit (HOSPITAL_COMMUNITY)
Admission: RE | Admit: 2016-08-13 | Discharge: 2016-08-13 | Disposition: A | Discharge status: Home / Self Care | Payer: PPO | Source: Ambulatory Visit | Attending: Pulmonary Disease | Admitting: Pulmonary Disease

## 2016-08-13 DIAGNOSIS — Z1231 Encounter for screening mammogram for malignant neoplasm of breast: Secondary | ICD-10-CM | POA: Diagnosis not present

## 2016-09-10 DIAGNOSIS — I10 Essential (primary) hypertension: Secondary | ICD-10-CM | POA: Diagnosis not present

## 2016-09-10 DIAGNOSIS — E119 Type 2 diabetes mellitus without complications: Secondary | ICD-10-CM | POA: Diagnosis not present

## 2016-09-10 DIAGNOSIS — J019 Acute sinusitis, unspecified: Secondary | ICD-10-CM | POA: Diagnosis not present

## 2016-10-25 DIAGNOSIS — M545 Low back pain: Secondary | ICD-10-CM | POA: Diagnosis not present

## 2016-10-25 DIAGNOSIS — K210 Gastro-esophageal reflux disease with esophagitis: Secondary | ICD-10-CM | POA: Diagnosis not present

## 2016-10-25 DIAGNOSIS — I10 Essential (primary) hypertension: Secondary | ICD-10-CM | POA: Diagnosis not present

## 2016-10-25 DIAGNOSIS — E1165 Type 2 diabetes mellitus with hyperglycemia: Secondary | ICD-10-CM | POA: Diagnosis not present

## 2016-11-28 DIAGNOSIS — I10 Essential (primary) hypertension: Secondary | ICD-10-CM | POA: Diagnosis not present

## 2016-11-28 DIAGNOSIS — K210 Gastro-esophageal reflux disease with esophagitis: Secondary | ICD-10-CM | POA: Diagnosis not present

## 2016-11-28 DIAGNOSIS — M545 Low back pain: Secondary | ICD-10-CM | POA: Diagnosis not present

## 2016-11-28 DIAGNOSIS — E785 Hyperlipidemia, unspecified: Secondary | ICD-10-CM | POA: Diagnosis not present

## 2016-11-28 DIAGNOSIS — G4700 Insomnia, unspecified: Secondary | ICD-10-CM | POA: Diagnosis not present

## 2016-11-28 DIAGNOSIS — E1165 Type 2 diabetes mellitus with hyperglycemia: Secondary | ICD-10-CM | POA: Diagnosis not present

## 2016-12-03 DIAGNOSIS — E1151 Type 2 diabetes mellitus with diabetic peripheral angiopathy without gangrene: Secondary | ICD-10-CM | POA: Diagnosis not present

## 2016-12-03 DIAGNOSIS — M545 Low back pain: Secondary | ICD-10-CM | POA: Diagnosis not present

## 2016-12-03 DIAGNOSIS — E785 Hyperlipidemia, unspecified: Secondary | ICD-10-CM | POA: Diagnosis not present

## 2016-12-03 DIAGNOSIS — I10 Essential (primary) hypertension: Secondary | ICD-10-CM | POA: Diagnosis not present

## 2016-12-13 DIAGNOSIS — M2140 Flat foot [pes planus] (acquired), unspecified foot: Secondary | ICD-10-CM | POA: Diagnosis not present

## 2016-12-13 DIAGNOSIS — M79671 Pain in right foot: Secondary | ICD-10-CM | POA: Diagnosis not present

## 2017-01-12 DIAGNOSIS — I872 Venous insufficiency (chronic) (peripheral): Secondary | ICD-10-CM | POA: Diagnosis not present

## 2017-01-12 DIAGNOSIS — E119 Type 2 diabetes mellitus without complications: Secondary | ICD-10-CM | POA: Diagnosis not present

## 2017-03-11 DIAGNOSIS — I10 Essential (primary) hypertension: Secondary | ICD-10-CM | POA: Diagnosis not present

## 2017-03-11 DIAGNOSIS — M545 Low back pain: Secondary | ICD-10-CM | POA: Diagnosis not present

## 2017-03-11 DIAGNOSIS — E119 Type 2 diabetes mellitus without complications: Secondary | ICD-10-CM | POA: Diagnosis not present

## 2017-03-11 DIAGNOSIS — E785 Hyperlipidemia, unspecified: Secondary | ICD-10-CM | POA: Diagnosis not present

## 2017-03-13 ENCOUNTER — Other Ambulatory Visit: Payer: Self-pay | Admitting: Pulmonary Disease

## 2017-03-13 DIAGNOSIS — M545 Low back pain: Principal | ICD-10-CM

## 2017-03-13 DIAGNOSIS — G8929 Other chronic pain: Secondary | ICD-10-CM

## 2017-04-03 ENCOUNTER — Ambulatory Visit
Admission: RE | Admit: 2017-04-03 | Discharge: 2017-04-03 | Disposition: A | Discharge status: Home / Self Care | Payer: PRIVATE HEALTH INSURANCE | Source: Ambulatory Visit | Attending: Pulmonary Disease | Admitting: Pulmonary Disease

## 2017-04-03 ENCOUNTER — Encounter: Payer: Self-pay | Admitting: Radiology

## 2017-04-03 DIAGNOSIS — M545 Low back pain: Principal | ICD-10-CM

## 2017-04-03 DIAGNOSIS — M47817 Spondylosis without myelopathy or radiculopathy, lumbosacral region: Secondary | ICD-10-CM | POA: Diagnosis not present

## 2017-04-03 DIAGNOSIS — G8929 Other chronic pain: Secondary | ICD-10-CM

## 2017-04-03 MED ORDER — IOPAMIDOL (ISOVUE-M 200) INJECTION 41%
1.0000 mL | Freq: Once | INTRAMUSCULAR | Status: AC
  Administered 2017-04-03: 1 mL via EPIDURAL

## 2017-04-03 MED ORDER — METHYLPREDNISOLONE ACETATE 40 MG/ML INJ SUSP (RADIOLOG
120.0000 mg | Freq: Once | INTRAMUSCULAR | Status: AC
  Administered 2017-04-03: 120 mg via EPIDURAL

## 2017-04-03 NOTE — Discharge Instructions (Signed)

## 2017-04-11 ENCOUNTER — Telehealth: Payer: Self-pay | Admitting: Radiology

## 2017-04-11 NOTE — Telephone Encounter (Signed)
Returned pt's call. Asked her to call her dr. To see if he wanted her to have another injection since her pain was not improved. Gave her our number in case she had other questions.

## 2017-06-13 DIAGNOSIS — Z23 Encounter for immunization: Secondary | ICD-10-CM | POA: Diagnosis not present

## 2017-06-13 DIAGNOSIS — Z0000 Encounter for general adult medical examination without abnormal findings: Secondary | ICD-10-CM | POA: Diagnosis not present

## 2017-06-25 DIAGNOSIS — Z961 Presence of intraocular lens: Secondary | ICD-10-CM | POA: Diagnosis not present

## 2017-06-25 DIAGNOSIS — E119 Type 2 diabetes mellitus without complications: Secondary | ICD-10-CM | POA: Diagnosis not present

## 2017-06-25 DIAGNOSIS — H04123 Dry eye syndrome of bilateral lacrimal glands: Secondary | ICD-10-CM | POA: Diagnosis not present

## 2017-07-09 DIAGNOSIS — Z1211 Encounter for screening for malignant neoplasm of colon: Secondary | ICD-10-CM | POA: Diagnosis not present

## 2017-07-23 ENCOUNTER — Other Ambulatory Visit: Payer: Self-pay | Admitting: Pulmonary Disease

## 2017-07-23 DIAGNOSIS — G8929 Other chronic pain: Secondary | ICD-10-CM

## 2017-07-23 DIAGNOSIS — M545 Low back pain: Principal | ICD-10-CM

## 2017-07-29 ENCOUNTER — Ambulatory Visit
Admission: RE | Admit: 2017-07-29 | Discharge: 2017-07-29 | Disposition: A | Discharge status: Home / Self Care | Payer: PPO | Source: Ambulatory Visit | Attending: Pulmonary Disease | Admitting: Pulmonary Disease

## 2017-07-29 DIAGNOSIS — M545 Low back pain: Principal | ICD-10-CM

## 2017-07-29 DIAGNOSIS — M47817 Spondylosis without myelopathy or radiculopathy, lumbosacral region: Secondary | ICD-10-CM | POA: Diagnosis not present

## 2017-07-29 DIAGNOSIS — G8929 Other chronic pain: Secondary | ICD-10-CM

## 2017-07-29 MED ORDER — IOPAMIDOL (ISOVUE-M 200) INJECTION 41%
1.0000 mL | Freq: Once | INTRAMUSCULAR | Status: AC
  Administered 2017-07-29: 1 mL via EPIDURAL

## 2017-07-29 MED ORDER — METHYLPREDNISOLONE ACETATE 40 MG/ML INJ SUSP (RADIOLOG
120.0000 mg | Freq: Once | INTRAMUSCULAR | Status: AC
  Administered 2017-07-29: 120 mg via EPIDURAL

## 2017-07-29 NOTE — Discharge Instructions (Signed)

## 2017-08-17 DIAGNOSIS — J069 Acute upper respiratory infection, unspecified: Secondary | ICD-10-CM | POA: Diagnosis not present

## 2017-08-22 ENCOUNTER — Other Ambulatory Visit: Payer: Self-pay | Admitting: Pulmonary Disease

## 2017-08-22 DIAGNOSIS — G8929 Other chronic pain: Secondary | ICD-10-CM

## 2017-08-22 DIAGNOSIS — M545 Low back pain: Principal | ICD-10-CM

## 2017-08-28 ENCOUNTER — Ambulatory Visit
Admission: RE | Admit: 2017-08-28 | Discharge: 2017-08-28 | Disposition: A | Discharge status: Home / Self Care | Payer: PPO | Source: Ambulatory Visit | Attending: Pulmonary Disease | Admitting: Pulmonary Disease

## 2017-08-28 DIAGNOSIS — M545 Low back pain: Principal | ICD-10-CM

## 2017-08-28 DIAGNOSIS — G8929 Other chronic pain: Secondary | ICD-10-CM

## 2017-08-28 DIAGNOSIS — M47817 Spondylosis without myelopathy or radiculopathy, lumbosacral region: Secondary | ICD-10-CM | POA: Diagnosis not present

## 2017-08-28 MED ORDER — METHYLPREDNISOLONE ACETATE 40 MG/ML INJ SUSP (RADIOLOG
120.0000 mg | Freq: Once | INTRAMUSCULAR | Status: AC
  Administered 2017-08-28: 120 mg via EPIDURAL

## 2017-08-28 MED ORDER — IOPAMIDOL (ISOVUE-M 200) INJECTION 41%
1.0000 mL | Freq: Once | INTRAMUSCULAR | Status: AC
  Administered 2017-08-28: 1 mL via EPIDURAL

## 2017-08-28 NOTE — Discharge Instructions (Signed)

## 2017-10-16 DIAGNOSIS — I10 Essential (primary) hypertension: Secondary | ICD-10-CM | POA: Diagnosis not present

## 2017-10-16 DIAGNOSIS — E119 Type 2 diabetes mellitus without complications: Secondary | ICD-10-CM | POA: Diagnosis not present

## 2017-10-16 DIAGNOSIS — J40 Bronchitis, not specified as acute or chronic: Secondary | ICD-10-CM | POA: Diagnosis not present

## 2017-10-16 DIAGNOSIS — R109 Unspecified abdominal pain: Secondary | ICD-10-CM | POA: Diagnosis not present

## 2017-11-21 DIAGNOSIS — K210 Gastro-esophageal reflux disease with esophagitis: Secondary | ICD-10-CM | POA: Diagnosis not present

## 2017-11-21 DIAGNOSIS — I10 Essential (primary) hypertension: Secondary | ICD-10-CM | POA: Diagnosis not present

## 2017-11-21 DIAGNOSIS — E1165 Type 2 diabetes mellitus with hyperglycemia: Secondary | ICD-10-CM | POA: Diagnosis not present

## 2017-11-21 DIAGNOSIS — M545 Low back pain: Secondary | ICD-10-CM | POA: Diagnosis not present

## 2017-11-29 DIAGNOSIS — E1165 Type 2 diabetes mellitus with hyperglycemia: Secondary | ICD-10-CM | POA: Diagnosis not present

## 2017-11-29 DIAGNOSIS — F419 Anxiety disorder, unspecified: Secondary | ICD-10-CM | POA: Diagnosis not present

## 2017-11-29 DIAGNOSIS — M545 Low back pain: Secondary | ICD-10-CM | POA: Diagnosis not present

## 2017-11-29 DIAGNOSIS — I10 Essential (primary) hypertension: Secondary | ICD-10-CM | POA: Diagnosis not present

## 2018-04-22 IMAGING — XA Imaging study
2 series · 2 of 2 positions shown · non-contrast
Comparison: none

CLINICAL DATA: Lumbosacral spondylosis without myelopathy. Left low
back, left buttock, and left groin pain. No significant relief from
the prior injection at L4-5.

[Series 1: ortho adipose · 1 of 1 slices shown (1 of 2)]
[im 1/1]
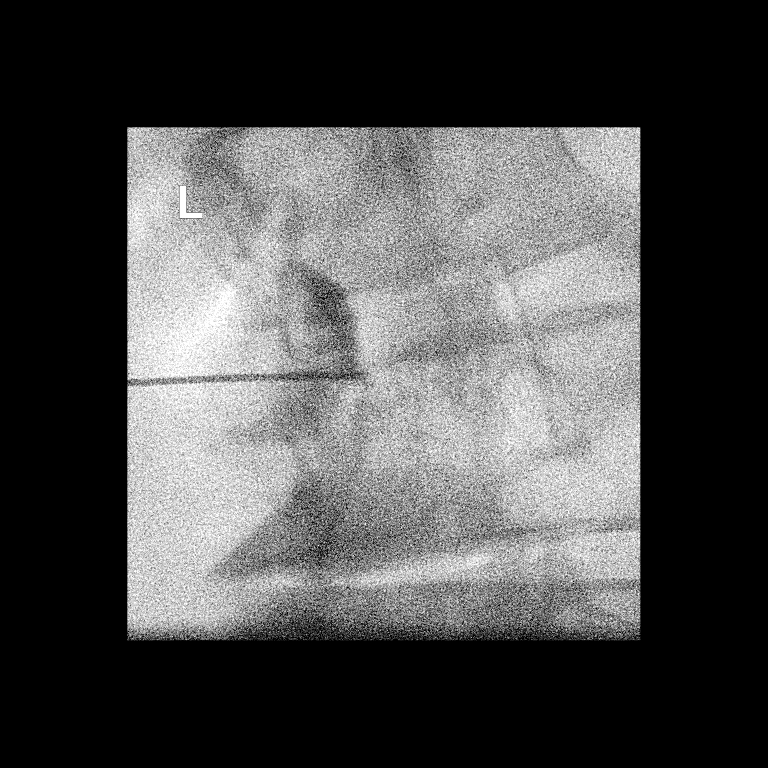

[Series 2: ortho adipose · 1 of 1 slices shown (2 of 2)]
[im 1/1]
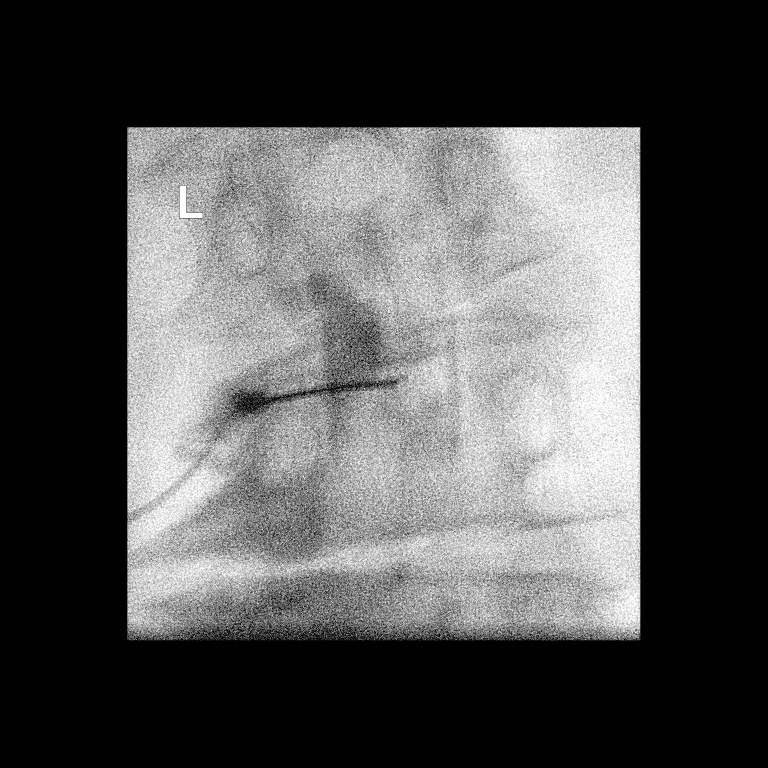

[2 of 2 positions shown; findings below may reference images not displayed]

FLUOROSCOPY TIME:  Radiation Exposure Index (as provided by the
fluoroscopic device): 24.27 microGray*m^2

Fluoroscopy Time (in minutes and seconds):  12 seconds

PROCEDURE:
The procedure, risks, benefits, and alternatives were explained to
the patient. Questions regarding the procedure were encouraged and
answered. The patient understands and consents to the procedure.

LUMBAR EPIDURAL INJECTION:

An interlaminar approach was performed on the left at L2-3. The
overlying skin was cleansed and anesthetized. A 3.5 inch 20 gauge
epidural needle was advanced using loss-of-resistance technique.

DIAGNOSTIC EPIDURAL INJECTION:

Injection of Isovue-M 200 shows a good epidural pattern with spread
above and below the level of needle placement, primarily on the
left. No vascular opacification is seen.

THERAPEUTIC EPIDURAL INJECTION:

120 mg of Depo-Medrol mixed with 3 mL of 1% lidocaine were
instilled. The procedure was well-tolerated, and the patient was
discharged thirty minutes following the injection in good condition.

COMPLICATIONS:
None
IMPRESSION: Technically successful lumbar interlaminar epidural injection on the
left at L2-3.

## 2018-09-10 DIAGNOSIS — E1165 Type 2 diabetes mellitus with hyperglycemia: Secondary | ICD-10-CM | POA: Diagnosis not present

## 2018-09-10 DIAGNOSIS — I10 Essential (primary) hypertension: Secondary | ICD-10-CM | POA: Diagnosis not present

## 2018-09-10 DIAGNOSIS — E785 Hyperlipidemia, unspecified: Secondary | ICD-10-CM | POA: Diagnosis not present

## 2018-09-10 DIAGNOSIS — M545 Low back pain: Secondary | ICD-10-CM | POA: Diagnosis not present

## 2018-10-02 DIAGNOSIS — F419 Anxiety disorder, unspecified: Secondary | ICD-10-CM | POA: Diagnosis not present

## 2018-10-02 DIAGNOSIS — M545 Low back pain: Secondary | ICD-10-CM | POA: Diagnosis not present

## 2018-10-02 DIAGNOSIS — E1165 Type 2 diabetes mellitus with hyperglycemia: Secondary | ICD-10-CM | POA: Diagnosis not present

## 2018-10-02 DIAGNOSIS — I10 Essential (primary) hypertension: Secondary | ICD-10-CM | POA: Diagnosis not present

## 2018-10-03 ENCOUNTER — Other Ambulatory Visit: Payer: Self-pay | Admitting: Pulmonary Disease

## 2018-10-03 DIAGNOSIS — G8929 Other chronic pain: Secondary | ICD-10-CM

## 2018-10-03 DIAGNOSIS — M5442 Lumbago with sciatica, left side: Principal | ICD-10-CM

## 2018-10-03 DIAGNOSIS — M5441 Lumbago with sciatica, right side: Principal | ICD-10-CM

## 2018-10-10 ENCOUNTER — Ambulatory Visit
Admission: RE | Admit: 2018-10-10 | Discharge: 2018-10-10 | Disposition: A | Discharge status: Home / Self Care | Payer: PPO | Source: Ambulatory Visit | Attending: Pulmonary Disease | Admitting: Pulmonary Disease

## 2018-10-10 DIAGNOSIS — G8929 Other chronic pain: Secondary | ICD-10-CM

## 2018-10-10 DIAGNOSIS — M5442 Lumbago with sciatica, left side: Principal | ICD-10-CM

## 2018-10-10 DIAGNOSIS — M5441 Lumbago with sciatica, right side: Principal | ICD-10-CM

## 2018-10-10 DIAGNOSIS — M5416 Radiculopathy, lumbar region: Secondary | ICD-10-CM | POA: Diagnosis not present

## 2018-10-10 MED ORDER — IOPAMIDOL (ISOVUE-M 200) INJECTION 41%
1.0000 mL | Freq: Once | INTRAMUSCULAR | Status: AC
  Administered 2018-10-10: 1 mL via EPIDURAL

## 2018-10-10 MED ORDER — METHYLPREDNISOLONE ACETATE 40 MG/ML INJ SUSP (RADIOLOG
120.0000 mg | Freq: Once | INTRAMUSCULAR | Status: AC
  Administered 2018-10-10: 120 mg via EPIDURAL

## 2018-11-05 DIAGNOSIS — E1165 Type 2 diabetes mellitus with hyperglycemia: Secondary | ICD-10-CM | POA: Diagnosis not present

## 2018-11-05 DIAGNOSIS — E119 Type 2 diabetes mellitus without complications: Secondary | ICD-10-CM | POA: Diagnosis not present

## 2018-11-05 DIAGNOSIS — I998 Other disorder of circulatory system: Secondary | ICD-10-CM | POA: Diagnosis not present

## 2018-11-13 ENCOUNTER — Other Ambulatory Visit: Payer: Self-pay | Admitting: Pulmonary Disease

## 2018-11-13 DIAGNOSIS — M545 Low back pain: Principal | ICD-10-CM

## 2018-11-13 DIAGNOSIS — G8929 Other chronic pain: Secondary | ICD-10-CM

## 2018-11-19 ENCOUNTER — Ambulatory Visit
Admission: RE | Admit: 2018-11-19 | Discharge: 2018-11-19 | Disposition: A | Discharge status: Home / Self Care | Payer: PPO | Source: Ambulatory Visit | Attending: Pulmonary Disease | Admitting: Pulmonary Disease

## 2018-11-19 DIAGNOSIS — M545 Low back pain: Principal | ICD-10-CM

## 2018-11-19 DIAGNOSIS — G8929 Other chronic pain: Secondary | ICD-10-CM

## 2018-11-19 DIAGNOSIS — M4726 Other spondylosis with radiculopathy, lumbar region: Secondary | ICD-10-CM | POA: Diagnosis not present

## 2018-11-19 MED ORDER — METHYLPREDNISOLONE ACETATE 40 MG/ML INJ SUSP (RADIOLOG
120.0000 mg | Freq: Once | INTRAMUSCULAR | Status: AC
  Administered 2018-11-19: 120 mg via EPIDURAL

## 2018-11-19 MED ORDER — IOPAMIDOL (ISOVUE-M 200) INJECTION 41%
1.0000 mL | Freq: Once | INTRAMUSCULAR | Status: AC
  Administered 2018-11-19: 1 mL via EPIDURAL

## 2018-11-19 NOTE — Discharge Instructions (Signed)

## 2018-11-24 DIAGNOSIS — M545 Low back pain: Secondary | ICD-10-CM | POA: Diagnosis not present

## 2018-11-26 DIAGNOSIS — H43811 Vitreous degeneration, right eye: Secondary | ICD-10-CM | POA: Diagnosis not present

## 2018-11-26 DIAGNOSIS — Z9841 Cataract extraction status, right eye: Secondary | ICD-10-CM | POA: Diagnosis not present

## 2018-11-26 DIAGNOSIS — Z961 Presence of intraocular lens: Secondary | ICD-10-CM | POA: Diagnosis not present

## 2018-11-26 DIAGNOSIS — E119 Type 2 diabetes mellitus without complications: Secondary | ICD-10-CM | POA: Diagnosis not present

## 2019-09-26 ENCOUNTER — Other Ambulatory Visit: Payer: Self-pay

## 2019-09-26 ENCOUNTER — Ambulatory Visit
Admission: EM | Admit: 2019-09-26 | Discharge: 2019-09-26 | Disposition: A | Discharge status: Home / Self Care | Payer: PPO | Attending: Emergency Medicine | Admitting: Emergency Medicine

## 2019-09-26 ENCOUNTER — Ambulatory Visit (INDEPENDENT_AMBULATORY_CARE_PROVIDER_SITE_OTHER): Payer: PPO

## 2019-09-26 DIAGNOSIS — M25511 Pain in right shoulder: Secondary | ICD-10-CM

## 2019-09-26 MED ORDER — PREDNISONE 10 MG PO TABS: 20.0000 mg | ORAL_TABLET | Freq: Every day | ORAL | 0 refills | Status: DC

## 2019-09-26 MED ORDER — DEXAMETHASONE SODIUM PHOSPHATE 10 MG/ML IJ SOLN: 20.0000 mg | Freq: Once | INTRAMUSCULAR | Status: DC

## 2019-09-26 MED ORDER — IBUPROFEN 800 MG PO TABS: 800.0000 mg | ORAL_TABLET | Freq: Three times a day (TID) | ORAL | 0 refills | Status: DC

## 2019-09-26 NOTE — ED Triage Notes (Addendum)
Pt presents to UC w/ family member. Pt c/o right shoulder pain x2 weeks, denies injury or fall. Pt states it hurts w/ every movement. Pt is requesting xray of shoulder

## 2019-09-26 NOTE — ED Provider Notes (Signed)
[facility]    CSN: [id number] Arrival date & time: 09/26/19  1216      History   Chief Complaint Chief Complaint  Patient presents with  . Rt shoulder pain    HPI CATRICE BERRIDGE is a 86 y.o. female.   Jenalee Budhram 86 years old female  presented to the urgent care with a complaint of right shoulder  pain that began more than 2-3 weeks ago.  It develop gradually.  She localizes the pain to the right shoulder.  She describes the pain as constant and achy and rated at 7 on a scale of 1-10.  Currently unable to move her arm and shoulder.  She  has tried OTC tylenol medications without relief.  Her symptoms  are made worse with movement .  She denies similar symptoms in the past.  Denies trauma and injury to the right shoulder.  The history is provided by the patient. No language interpreter was used.    History reviewed. No pertinent past medical history.  Patient Active Problem List   Diagnosis Date Noted  . DEGENERATIVE DISC DISEASE, LUMBAR SPINE 07/21/2009    History reviewed. No pertinent surgical history.  OB History   No obstetric history on file.      Home Medications    Prior to Admission medications   Medication Sig Start Date End Date Taking? Authorizing Provider  ALPRAZolam (XANAX) 0.25 MG tablet Take 0.25 mg by mouth.    [provider]  aspirin EC 81 MG tablet Take 162 mg by mouth.    [provider]  glipiZIDE (GLUCOTROL XL) 2.5 MG 24 hr tablet Take 2.5 mg by mouth.    [provider]  ibuprofen (ADVIL) 800 MG tablet Take 1 tablet (800 mg total) by mouth 3 (three) times daily. 09/26/19   Vaunda Gutterman, Darrelyn Hillock, FNP  losartan-hydrochlorothiazide (HYZAAR) 50-12.5 MG tablet Take 1 tablet by mouth.    [provider]  omeprazole (PRILOSEC) 20 MG capsule Take 20 mg by mouth.    [provider]  ranitidine (ZANTAC) 150 MG capsule Take 150 mg by mouth.    [provider]  TRADJENTA 5 MG TABS  tablet Take 5 mg by mouth daily. 09/17/15   [provider]  traMADol (ULTRAM) 50 MG tablet Take 50 mg by mouth.    [provider]    Family History Family History  Problem Relation Age of Onset  . Healthy Mother   . Healthy Father     Social History Social History   Tobacco Use  . Smoking status: Never Smoker  . Smokeless tobacco: Never Used  Substance Use Topics  . Alcohol use: Not on file  . Drug use: Not on file     Allergies   Patient has no known allergies.   Review of Systems Review of Systems  Constitutional: Negative.   HENT: Negative.   Respiratory: Negative.   Cardiovascular: Negative.   Musculoskeletal:       Joint pain     Physical Exam Triage Vital Signs ED Triage Vitals  Enc Vitals Group     BP 09/26/19 1238 (!) 154/84     Pulse Rate 09/26/19 1238 85     Resp 09/26/19 1238 15     Temp 09/26/19 1238 98.6 F (37 C)     Temp Source 09/26/19 1238 Oral     SpO2 09/26/19 1238 95 %     Weight --      Height --  Head Circumference --      Peak Flow --      Pain Score 09/26/19 1244 8     Pain Loc --      Pain Edu? --      Excl. in [place]? --    No data found.  Updated Vital Signs BP (!) 154/84 (BP Location: Left Arm)   Pulse 85   Temp 98.6 F (37 C) (Oral)   Resp 15   SpO2 95%   Visual Acuity Right Eye Distance:   Left Eye Distance:   Bilateral Distance:    Right Eye Near:   Left Eye Near:    Bilateral Near:     Physical Exam Vitals and nursing note reviewed.  Constitutional:      General: She is not in acute distress.    Appearance: Normal appearance. She is normal weight. She is ill-appearing. She is not toxic-appearing.  Cardiovascular:     Rate and Rhythm: Normal rate and regular rhythm.     Heart sounds: Normal heart sounds.  Pulmonary:     Effort: Pulmonary effort is normal.     Breath sounds: Normal breath sounds.  Musculoskeletal:        General: Tenderness present.     Right shoulder: Swelling  and tenderness present. Decreased range of motion. Normal strength. Normal pulse.     Left shoulder: Normal.     Comments: Right shoulder: The right shoulder is without obvious asymmetry or deformity when compared to the left shoulder. Reduced range of motion on the right shoulder.  Pain with movement.  Unable to complete drop arm test due to patient being excruciating pain.  Neurological:     Mental Status: She is alert.      UC Treatments / Results  Labs (all labs ordered are listed, but only abnormal results are displayed) Labs Reviewed - No data to display  EKG   Radiology DG Shoulder Right  Result Date: 09/26/2019 CLINICAL DATA:  Right shoulder pain EXAM: RIGHT SHOULDER - 2+ VIEW COMPARISON:  None. FINDINGS: No fracture or dislocation of the right shoulder. There is mild acromioclavicular and glenohumeral arthrosis. There is a low lying position of the humeral head, suggestive of glenohumeral joint effusion. There is extensive, coarse calcification about the greater tuberosity of the humerus, in the expected vicinity of the rotator cuff insertion. The partially imaged right chest is unremarkable. IMPRESSION: 1. No fracture or dislocation of the right shoulder. 2. Probable glenohumeral joint effusion. 3. Mild acromioclavicular and glenohumeral arthrosis. 4. Extensive coarse calcification about the greater tuberosity of the humerus, in the expected vicinity of the rotator cuff insertion. These findings can be seen in chronic rotator cuff tendinopathy and/or calcific tendinitis. Electronically Signed   By: Eddie Candle M.D.   On: 09/26/2019 13:16    Procedures Procedures (including critical care time)  Medications Ordered in UC Medications  dexamethasone (DECADRON) injection 20 mg (has no administration in time range)    Initial Impression / Assessment and Plan / UC Course  I have reviewed the triage vital signs and the nursing notes.  Pertinent labs & imaging results that were  available during my care of the patient were reviewed by me and considered in my medical decision making (see chart for details).   Right shoulder x-ray was ordered and result reviewed.  Results show possible chronic rotator cuff tendinopathy or calcific tendinitis.  Advised patient to follow-up with primary care for referral to orthopedic.  Shoulder immobilizer was applied in office.  Patient verbalized understanding of the plan of care.  Final Clinical Impressions(s) / UC Diagnoses   Final diagnoses:  Acute pain of right shoulder     Discharge Instructions     Advised patient to follow-up with primary care for possible referral to orthopedic Advised patient to take ibuprofen with food Shoulder immobilizer was applied in office    ED Prescriptions    Medication Sig Dispense Auth. Provider   ibuprofen (ADVIL) 800 MG tablet Take 1 tablet (800 mg total) by mouth 3 (three) times daily. 21 tablet Dashanae Longfield, Darrelyn Hillock, FNP     PDMP not reviewed this encounter.   Emerson Monte, [place] 09/26/19 1429

## 2019-09-26 NOTE — Discharge Instructions (Addendum)
Advised patient to follow-up with primary care for possible referral to orthopedic Advised patient to take ibuprofen with food Shoulder immobilizer was applied in office

## 2019-09-29 DIAGNOSIS — M545 Low back pain: Secondary | ICD-10-CM | POA: Diagnosis not present

## 2019-09-29 DIAGNOSIS — E1165 Type 2 diabetes mellitus with hyperglycemia: Secondary | ICD-10-CM | POA: Diagnosis not present

## 2019-09-29 DIAGNOSIS — F5101 Primary insomnia: Secondary | ICD-10-CM | POA: Diagnosis not present

## 2019-09-29 DIAGNOSIS — Z8711 Personal history of peptic ulcer disease: Secondary | ICD-10-CM | POA: Diagnosis not present

## 2019-09-29 DIAGNOSIS — E782 Mixed hyperlipidemia: Secondary | ICD-10-CM | POA: Diagnosis not present

## 2019-09-29 DIAGNOSIS — I10 Essential (primary) hypertension: Secondary | ICD-10-CM | POA: Diagnosis not present

## 2019-09-29 DIAGNOSIS — M25511 Pain in right shoulder: Secondary | ICD-10-CM | POA: Diagnosis not present

## 2019-09-29 DIAGNOSIS — K219 Gastro-esophageal reflux disease without esophagitis: Secondary | ICD-10-CM | POA: Diagnosis not present

## 2019-10-21 DIAGNOSIS — E782 Mixed hyperlipidemia: Secondary | ICD-10-CM | POA: Diagnosis not present

## 2019-10-21 DIAGNOSIS — E1165 Type 2 diabetes mellitus with hyperglycemia: Secondary | ICD-10-CM | POA: Diagnosis not present

## 2019-10-21 DIAGNOSIS — I10 Essential (primary) hypertension: Secondary | ICD-10-CM | POA: Diagnosis not present

## 2019-10-27 DIAGNOSIS — K219 Gastro-esophageal reflux disease without esophagitis: Secondary | ICD-10-CM | POA: Diagnosis not present

## 2019-10-27 DIAGNOSIS — E782 Mixed hyperlipidemia: Secondary | ICD-10-CM | POA: Diagnosis not present

## 2019-10-27 DIAGNOSIS — F5101 Primary insomnia: Secondary | ICD-10-CM | POA: Diagnosis not present

## 2019-10-27 DIAGNOSIS — E1165 Type 2 diabetes mellitus with hyperglycemia: Secondary | ICD-10-CM | POA: Diagnosis not present

## 2019-10-27 DIAGNOSIS — M545 Low back pain: Secondary | ICD-10-CM | POA: Diagnosis not present

## 2019-10-27 DIAGNOSIS — Z8711 Personal history of peptic ulcer disease: Secondary | ICD-10-CM | POA: Diagnosis not present

## 2019-10-27 DIAGNOSIS — M25511 Pain in right shoulder: Secondary | ICD-10-CM | POA: Diagnosis not present

## 2019-10-27 DIAGNOSIS — I10 Essential (primary) hypertension: Secondary | ICD-10-CM | POA: Diagnosis not present

## 2019-11-27 DIAGNOSIS — E119 Type 2 diabetes mellitus without complications: Secondary | ICD-10-CM | POA: Diagnosis not present

## 2020-02-02 DIAGNOSIS — M545 Low back pain: Secondary | ICD-10-CM | POA: Diagnosis not present

## 2020-02-02 DIAGNOSIS — Z8711 Personal history of peptic ulcer disease: Secondary | ICD-10-CM | POA: Diagnosis not present

## 2020-02-02 DIAGNOSIS — K219 Gastro-esophageal reflux disease without esophagitis: Secondary | ICD-10-CM | POA: Diagnosis not present

## 2020-02-02 DIAGNOSIS — M25511 Pain in right shoulder: Secondary | ICD-10-CM | POA: Diagnosis not present

## 2020-02-02 DIAGNOSIS — F5101 Primary insomnia: Secondary | ICD-10-CM | POA: Diagnosis not present

## 2020-02-02 DIAGNOSIS — I10 Essential (primary) hypertension: Secondary | ICD-10-CM | POA: Diagnosis not present

## 2020-02-02 DIAGNOSIS — E782 Mixed hyperlipidemia: Secondary | ICD-10-CM | POA: Diagnosis not present

## 2020-02-02 DIAGNOSIS — E1165 Type 2 diabetes mellitus with hyperglycemia: Secondary | ICD-10-CM | POA: Diagnosis not present

## 2020-07-12 DIAGNOSIS — G3184 Mild cognitive impairment, so stated: Secondary | ICD-10-CM | POA: Diagnosis not present

## 2020-07-12 DIAGNOSIS — M25511 Pain in right shoulder: Secondary | ICD-10-CM | POA: Diagnosis not present

## 2020-07-12 DIAGNOSIS — K219 Gastro-esophageal reflux disease without esophagitis: Secondary | ICD-10-CM | POA: Diagnosis not present

## 2020-07-12 DIAGNOSIS — E782 Mixed hyperlipidemia: Secondary | ICD-10-CM | POA: Diagnosis not present

## 2020-07-12 DIAGNOSIS — Z8711 Personal history of peptic ulcer disease: Secondary | ICD-10-CM | POA: Diagnosis not present

## 2020-07-12 DIAGNOSIS — F5101 Primary insomnia: Secondary | ICD-10-CM | POA: Diagnosis not present

## 2020-07-12 DIAGNOSIS — Z23 Encounter for immunization: Secondary | ICD-10-CM | POA: Diagnosis not present

## 2020-07-12 DIAGNOSIS — M545 Low back pain, unspecified: Secondary | ICD-10-CM | POA: Diagnosis not present

## 2020-07-12 DIAGNOSIS — I10 Essential (primary) hypertension: Secondary | ICD-10-CM | POA: Diagnosis not present

## 2020-07-12 DIAGNOSIS — E1165 Type 2 diabetes mellitus with hyperglycemia: Secondary | ICD-10-CM | POA: Diagnosis not present

## 2020-08-10 DIAGNOSIS — I10 Essential (primary) hypertension: Secondary | ICD-10-CM | POA: Diagnosis not present

## 2020-08-10 DIAGNOSIS — F5101 Primary insomnia: Secondary | ICD-10-CM | POA: Diagnosis not present

## 2020-08-10 DIAGNOSIS — M545 Low back pain, unspecified: Secondary | ICD-10-CM | POA: Diagnosis not present

## 2020-08-10 DIAGNOSIS — M25511 Pain in right shoulder: Secondary | ICD-10-CM | POA: Diagnosis not present

## 2020-08-10 DIAGNOSIS — G3184 Mild cognitive impairment, so stated: Secondary | ICD-10-CM | POA: Diagnosis not present

## 2020-08-10 DIAGNOSIS — E1165 Type 2 diabetes mellitus with hyperglycemia: Secondary | ICD-10-CM | POA: Diagnosis not present

## 2020-08-10 DIAGNOSIS — K219 Gastro-esophageal reflux disease without esophagitis: Secondary | ICD-10-CM | POA: Diagnosis not present

## 2020-08-10 DIAGNOSIS — Z8711 Personal history of peptic ulcer disease: Secondary | ICD-10-CM | POA: Diagnosis not present

## 2020-08-10 DIAGNOSIS — E782 Mixed hyperlipidemia: Secondary | ICD-10-CM | POA: Diagnosis not present

## 2020-08-10 DIAGNOSIS — R32 Unspecified urinary incontinence: Secondary | ICD-10-CM | POA: Diagnosis not present

## 2020-10-13 DIAGNOSIS — E782 Mixed hyperlipidemia: Secondary | ICD-10-CM | POA: Diagnosis not present

## 2020-10-13 DIAGNOSIS — F5101 Primary insomnia: Secondary | ICD-10-CM | POA: Diagnosis not present

## 2020-10-13 DIAGNOSIS — R32 Unspecified urinary incontinence: Secondary | ICD-10-CM | POA: Diagnosis not present

## 2020-10-13 DIAGNOSIS — M545 Low back pain, unspecified: Secondary | ICD-10-CM | POA: Diagnosis not present

## 2020-10-13 DIAGNOSIS — G3184 Mild cognitive impairment, so stated: Secondary | ICD-10-CM | POA: Diagnosis not present

## 2020-10-13 DIAGNOSIS — Z712 Person consulting for explanation of examination or test findings: Secondary | ICD-10-CM | POA: Diagnosis not present

## 2020-10-13 DIAGNOSIS — Z23 Encounter for immunization: Secondary | ICD-10-CM | POA: Diagnosis not present

## 2020-10-13 DIAGNOSIS — Z0189 Encounter for other specified special examinations: Secondary | ICD-10-CM | POA: Diagnosis not present

## 2020-10-13 DIAGNOSIS — Z8711 Personal history of peptic ulcer disease: Secondary | ICD-10-CM | POA: Diagnosis not present

## 2020-10-13 DIAGNOSIS — E1165 Type 2 diabetes mellitus with hyperglycemia: Secondary | ICD-10-CM | POA: Diagnosis not present

## 2020-10-13 DIAGNOSIS — K219 Gastro-esophageal reflux disease without esophagitis: Secondary | ICD-10-CM | POA: Diagnosis not present

## 2020-10-13 DIAGNOSIS — M25511 Pain in right shoulder: Secondary | ICD-10-CM | POA: Diagnosis not present

## 2020-10-26 DIAGNOSIS — G3184 Mild cognitive impairment, so stated: Secondary | ICD-10-CM | POA: Diagnosis not present

## 2020-10-26 DIAGNOSIS — Z8711 Personal history of peptic ulcer disease: Secondary | ICD-10-CM | POA: Diagnosis not present

## 2020-10-26 DIAGNOSIS — N3281 Overactive bladder: Secondary | ICD-10-CM | POA: Diagnosis not present

## 2020-10-26 DIAGNOSIS — E1165 Type 2 diabetes mellitus with hyperglycemia: Secondary | ICD-10-CM | POA: Diagnosis not present

## 2020-10-26 DIAGNOSIS — M25511 Pain in right shoulder: Secondary | ICD-10-CM | POA: Diagnosis not present

## 2020-10-26 DIAGNOSIS — K219 Gastro-esophageal reflux disease without esophagitis: Secondary | ICD-10-CM | POA: Diagnosis not present

## 2020-10-26 DIAGNOSIS — M545 Low back pain, unspecified: Secondary | ICD-10-CM | POA: Diagnosis not present

## 2020-10-26 DIAGNOSIS — I10 Essential (primary) hypertension: Secondary | ICD-10-CM | POA: Diagnosis not present

## 2020-10-26 DIAGNOSIS — F5101 Primary insomnia: Secondary | ICD-10-CM | POA: Diagnosis not present

## 2020-10-26 DIAGNOSIS — E782 Mixed hyperlipidemia: Secondary | ICD-10-CM | POA: Diagnosis not present

## 2020-11-28 DIAGNOSIS — E119 Type 2 diabetes mellitus without complications: Secondary | ICD-10-CM | POA: Diagnosis not present

## 2021-01-19 ENCOUNTER — Encounter: Payer: Self-pay | Admitting: Emergency Medicine

## 2021-01-19 ENCOUNTER — Ambulatory Visit
Admission: EM | Admit: 2021-01-19 | Discharge: 2021-01-19 | Disposition: A | Discharge status: Home / Self Care | Payer: PPO | Attending: Family Medicine | Admitting: Family Medicine

## 2021-01-19 ENCOUNTER — Other Ambulatory Visit: Payer: Self-pay

## 2021-01-19 DIAGNOSIS — R059 Cough, unspecified: Secondary | ICD-10-CM

## 2021-01-19 DIAGNOSIS — J019 Acute sinusitis, unspecified: Secondary | ICD-10-CM | POA: Diagnosis not present

## 2021-01-19 DIAGNOSIS — R0981 Nasal congestion: Secondary | ICD-10-CM

## 2021-01-19 HISTORY — DX: Cerebral infarction, unspecified: I63.9

## 2021-01-19 HISTORY — DX: Type 2 diabetes mellitus without complications: E11.9

## 2021-01-19 MED ORDER — BENZONATATE 100 MG PO CAPS: 100.0000 mg | ORAL_CAPSULE | Freq: Three times a day (TID) | ORAL | 0 refills | Status: AC | PRN

## 2021-01-19 MED ORDER — PREDNISONE 10 MG PO TABS: 10.0000 mg | ORAL_TABLET | Freq: Every day | ORAL | 0 refills | Status: AC

## 2021-01-19 MED ORDER — DOXYCYCLINE HYCLATE 100 MG PO CAPS: 100.0000 mg | ORAL_CAPSULE | Freq: Two times a day (BID) | ORAL | 0 refills | Status: AC

## 2021-01-19 NOTE — ED Provider Notes (Signed)
[facility]    CSN: [id number] Arrival date & time: 01/19/21  1613      History   Chief Complaint Chief Complaint  Patient presents with  . Cough    HPI [name] is a 87 y.o. female.   HPI  Patient presents today with persistent cough, occasionally productive, sinus and nasal congestion and  Generally not feeling well x 1 week. No fever. She suffers from seasonal allergies and takes Claritin and uses Pataday for eye irritation, however has not achieved any relief of current symptoms. Denies shortness of breath, wheezing, or chest congestion   Past Medical History:  Diagnosis Date  . Diabetes mellitus without complication ([place])   . Stroke [facility])     Patient Active Problem List   Diagnosis Date Noted  . DEGENERATIVE DISC DISEASE, LUMBAR SPINE 07/21/2009    Past Surgical History:  Procedure Laterality Date  . CHOLECYSTECTOMY      OB History   No obstetric history on file.      Home Medications    Prior to Admission medications   Medication Sig Start Date End Date Taking? Authorizing Provider  benzonatate (TESSALON) 100 MG capsule Take 1 capsule (100 mg total) by mouth 3 (three) times daily as needed for cough. 01/19/21  Yes Scot Jun, FNP  doxycycline (VIBRAMYCIN) 100 MG capsule Take 1 capsule (100 mg total) by mouth 2 (two) times daily. 01/19/21  Yes Scot Jun, FNP  predniSONE (DELTASONE) 10 MG tablet Take 1 tablet (10 mg total) by mouth daily with breakfast for 5 days. 01/19/21 01/24/21 Yes Scot Jun, FNP  ALPRAZolam Duanne Moron) 0.25 MG tablet Take 0.25 mg by mouth.    [provider]  aspirin EC 81 MG tablet Take 162 mg by mouth.    [provider]  glipiZIDE (GLUCOTROL XL) 2.5 MG 24 hr tablet Take 2.5 mg by mouth.    [provider]  ibuprofen (ADVIL) 800 MG tablet Take 1 tablet (800 mg total) by mouth 3 (three) times daily. 09/26/19   Avegno, Darrelyn Hillock, FNP  losartan-hydrochlorothiazide  (HYZAAR) 50-12.5 MG tablet Take 1 tablet by mouth.    [provider]  omeprazole (PRILOSEC) 20 MG capsule Take 20 mg by mouth.    [provider]  ranitidine (ZANTAC) 150 MG capsule Take 150 mg by mouth.    [provider]  TRADJENTA 5 MG TABS tablet Take 5 mg by mouth daily. 09/17/15   [provider]  traMADol (ULTRAM) 50 MG tablet Take 50 mg by mouth.    [provider]    Family History Family History  Problem Relation Age of Onset  . Healthy Mother   . Healthy Father     Social History Social History   Tobacco Use  . Smoking status: Never Smoker  . Smokeless tobacco: Never Used     Allergies   Patient has no known allergies.   Review of Systems Review of Systems Pertinent negatives listed in HPI  Physical Exam Triage Vital Signs ED Triage Vitals  Enc Vitals Group     BP 01/19/21 1649 (!) 143/67     Pulse Rate 01/19/21 1649 82     Resp 01/19/21 1649 19     Temp 01/19/21 1649 98 F (36.7 C)     Temp Source 01/19/21 1649 Oral     SpO2 01/19/21 1649 93 %     Weight --      Height --  Head Circumference --      Peak Flow --      Pain Score 01/19/21 1647 0     Pain Loc --      Pain Edu? --      Excl. in [place]? --    No data found.  Updated Vital Signs BP (!) 143/67 (BP Location: Right Arm)   Pulse 82   Temp 98 F (36.7 C) (Oral)   Resp 19   SpO2 93%   Visual Acuity Right Eye Distance:   Left Eye Distance:   Bilateral Distance:    Right Eye Near:   Left Eye Near:    Bilateral Near:     Physical Exam   General Appearance:    Alert, cooperative, no distress  HENT:   Normocephalic, ears normal, nares mucosal edema with congestion, rhinorrhea, oropharynx clear    Eyes:    PERRL, conjunctiva/corneas clear, EOM's intact       Lungs:     Clear to auscultation bilaterally, respirations unlabored  Heart:    Regular rate and rhythm  Neurologic:   Awake, alert, oriented x 3. No apparent focal neurological            defect.     UC Treatments / Results  Labs (all labs ordered are listed, but only abnormal results are displayed) Labs Reviewed - No data to display  EKG   Radiology No results found.  Procedures Procedures (including critical care time)  Medications Ordered in UC Medications - No data to display  Initial Impression / Assessment and Plan / UC Course  I have reviewed the triage vital signs and the nursing notes.  Pertinent labs & imaging results that were available during my care of the patient were reviewed by me and considered in my medical decision making (see chart for details).    Acute sinusitis with nasal congestion and cough treatment with doxycycline, prednisone 10 mg once daily for 5 days, and benzonatate Per[place].  PCP follow-up as needed.  ER if symptoms become severe. Final Clinical Impressions(s) / UC Diagnoses   Final diagnoses:  Acute non-recurrent sinusitis, unspecified location  Nasal congestion  Cough   Discharge Instructions   None    ED Prescriptions    Medication Sig Dispense Auth. Provider   doxycycline (VIBRAMYCIN) 100 MG capsule Take 1 capsule (100 mg total) by mouth 2 (two) times daily. 20 capsule Scot Jun, FNP   predniSONE (DELTASONE) 10 MG tablet Take 1 tablet (10 mg total) by mouth daily with breakfast for 5 days. 5 tablet Scot Jun, FNP   benzonatate (TESSALON) 100 MG capsule Take 1 capsule (100 mg total) by mouth 3 (three) times daily as needed for cough. 40 capsule Scot Jun, FNP     PDMP not reviewed this encounter.   Scot Jun, FNP 01/19/21 1723

## 2021-01-19 NOTE — ED Triage Notes (Signed)
Cough, sinus congestion, eyes red and puffy that started the beginning of this week.

## 2021-01-21 DIAGNOSIS — R0981 Nasal congestion: Secondary | ICD-10-CM | POA: Diagnosis not present

## 2021-01-21 DIAGNOSIS — U071 COVID-19: Secondary | ICD-10-CM | POA: Diagnosis not present

## 2021-01-21 DIAGNOSIS — R051 Acute cough: Secondary | ICD-10-CM | POA: Diagnosis not present

## 2021-01-27 ENCOUNTER — Emergency Department (HOSPITAL_COMMUNITY): Payer: PPO

## 2021-01-27 ENCOUNTER — Encounter (HOSPITAL_COMMUNITY): Payer: Self-pay

## 2021-01-27 ENCOUNTER — Other Ambulatory Visit: Payer: Self-pay

## 2021-01-27 ENCOUNTER — Emergency Department (HOSPITAL_COMMUNITY)
Admission: EM | Admit: 2021-01-27 | Discharge: 2021-01-27 | Disposition: A | Discharge status: Home / Self Care | Payer: PPO | Attending: Emergency Medicine | Admitting: Emergency Medicine

## 2021-01-27 ENCOUNTER — Ambulatory Visit
Admission: EM | Admit: 2021-01-27 | Discharge: 2021-01-27 | Disposition: A | Discharge status: Home / Self Care | Payer: PPO | Attending: Emergency Medicine | Admitting: Emergency Medicine

## 2021-01-27 DIAGNOSIS — E119 Type 2 diabetes mellitus without complications: Secondary | ICD-10-CM | POA: Insufficient documentation

## 2021-01-27 DIAGNOSIS — U071 COVID-19: Secondary | ICD-10-CM | POA: Insufficient documentation

## 2021-01-27 DIAGNOSIS — E871 Hypo-osmolality and hyponatremia: Secondary | ICD-10-CM | POA: Diagnosis not present

## 2021-01-27 DIAGNOSIS — Z79899 Other long term (current) drug therapy: Secondary | ICD-10-CM | POA: Diagnosis not present

## 2021-01-27 DIAGNOSIS — R079 Chest pain, unspecified: Secondary | ICD-10-CM

## 2021-01-27 DIAGNOSIS — Z7984 Long term (current) use of oral hypoglycemic drugs: Secondary | ICD-10-CM | POA: Diagnosis not present

## 2021-01-27 DIAGNOSIS — R0689 Other abnormalities of breathing: Secondary | ICD-10-CM | POA: Diagnosis not present

## 2021-01-27 DIAGNOSIS — R072 Precordial pain: Secondary | ICD-10-CM | POA: Diagnosis present

## 2021-01-27 DIAGNOSIS — I10 Essential (primary) hypertension: Secondary | ICD-10-CM | POA: Diagnosis not present

## 2021-01-27 DIAGNOSIS — R404 Transient alteration of awareness: Secondary | ICD-10-CM | POA: Diagnosis not present

## 2021-01-27 DIAGNOSIS — R402411 Glasgow coma scale score 13-15, in the field [EMT or ambulance]: Secondary | ICD-10-CM | POA: Diagnosis not present

## 2021-01-27 DIAGNOSIS — Z7982 Long term (current) use of aspirin: Secondary | ICD-10-CM | POA: Diagnosis not present

## 2021-01-27 DIAGNOSIS — R0789 Other chest pain: Secondary | ICD-10-CM | POA: Diagnosis not present

## 2021-01-27 LAB — CBC WITH DIFFERENTIAL/PLATELET
Abs Immature Granulocytes: 0.08 10*3/uL — ABNORMAL HIGH (ref 0.00–0.07)
Basophils Absolute: 0 10*3/uL (ref 0.0–0.1)
Basophils Relative: 0 %
Eosinophils Absolute: 0.1 10*3/uL (ref 0.0–0.5)
Eosinophils Relative: 1 %
HCT: 40.4 % (ref 36.0–46.0)
Hemoglobin: 13.3 g/dL (ref 12.0–15.0)
Immature Granulocytes: 1 %
Lymphocytes Relative: 25 %
Lymphs Abs: 2 10*3/uL (ref 0.7–4.0)
MCH: 29.4 pg (ref 26.0–34.0)
MCHC: 32.9 g/dL (ref 30.0–36.0)
MCV: 89.2 fL (ref 80.0–100.0)
Monocytes Absolute: 0.4 10*3/uL (ref 0.1–1.0)
Monocytes Relative: 5 %
Neutro Abs: 5.4 10*3/uL (ref 1.7–7.7)
Neutrophils Relative %: 68 %
Platelets: 280 10*3/uL (ref 150–400)
RBC: 4.53 MIL/uL (ref 3.87–5.11)
RDW: 12.8 % (ref 11.5–15.5)
WBC: 7.9 10*3/uL (ref 4.0–10.5)
nRBC: 0 % (ref 0.0–0.2)

## 2021-01-27 LAB — COMPREHENSIVE METABOLIC PANEL
ALT: 32 U/L (ref 0–44)
AST: 24 U/L (ref 15–41)
Albumin: 3.5 g/dL (ref 3.5–5.0)
Alkaline Phosphatase: 55 U/L (ref 38–126)
Anion gap: 8 (ref 5–15)
BUN: 13 mg/dL (ref 8–23)
CO2: 29 mmol/L (ref 22–32)
Calcium: 8.9 mg/dL (ref 8.9–10.3)
Chloride: 92 mmol/L — ABNORMAL LOW (ref 98–111)
Creatinine, Ser: 0.69 mg/dL (ref 0.44–1.00)
GFR, Estimated: >60 mL/min (ref >60)
Glucose, Bld: 105 mg/dL — ABNORMAL HIGH (ref 70–99)
Potassium: 3.7 mmol/L (ref 3.5–5.1)
Sodium: 129 mmol/L — ABNORMAL LOW (ref 135–145)
Total Bilirubin: 0.7 mg/dL (ref 0.3–1.2)
Total Protein: 6.2 g/dL — ABNORMAL LOW (ref 6.5–8.1)

## 2021-01-27 LAB — RESP PANEL BY RT-PCR (FLU A&B, COVID) ARPGX2
Influenza A by PCR: NEGATIVE
Influenza B by PCR: NEGATIVE
SARS Coronavirus 2 by RT PCR: POSITIVE — AB

## 2021-01-27 LAB — TROPONIN I (HIGH SENSITIVITY)
Troponin I (High Sensitivity): 15 ng/L (ref <18)
Troponin I (High Sensitivity): 15 ng/L (ref <18)

## 2021-01-27 MED ORDER — LIDOCAINE VISCOUS HCL 2 % MT SOLN
15.0000 mL | Freq: Once | OROMUCOSAL | Status: AC
  Administered 2021-01-27: 15 mL via ORAL
  Filled 2021-01-27: qty 15

## 2021-01-27 MED ORDER — ALUM & MAG HYDROXIDE-SIMETH 200-200-20 MG/5ML PO SUSP
30.0000 mL | Freq: Once | ORAL | Status: AC
  Administered 2021-01-27: 30 mL via ORAL
  Filled 2021-01-27: qty 30

## 2021-01-27 MED ORDER — IOHEXOL 350 MG/ML SOLN
100.0000 mL | Freq: Once | INTRAVENOUS | Status: AC | PRN
  Administered 2021-01-27: 100 mL via INTRAVENOUS

## 2021-01-27 MED ORDER — SODIUM CHLORIDE 0.9 % IV BOLUS
1000.0000 mL | Freq: Once | INTRAVENOUS | Status: AC
  Administered 2021-01-27: 1000 mL via INTRAVENOUS

## 2021-01-27 NOTE — ED Triage Notes (Signed)
CP COVID over a week ago, pain in reproducible, from [facility], skin warm and dry, alert and oriented/ demented at times per EMS.

## 2021-01-27 NOTE — Discharge Instructions (Signed)
You came to the emergency department today to be evaluated for your chest pain.  Your EKG, chest x-ray, and lab work were reassuring that you are not having acute heart attack today.  The CT scan of your chest showed no blood clots in your lungs.    Your lab work revealed that you have a low amount of sodium.  Please make sure to stay well-hydrated and eat regularly.  You may increase your salt intake over this next week.  Please follow-up with your primary care provider in a week to have this lab value rechecked.  Get help right away if: Your chest pain gets worse. You have a cough that gets worse, or you cough up blood. You have severe pain in your abdomen. You faint. You have sudden, unexplained chest discomfort. You have sudden, unexplained discomfort in your arms, back, neck, or jaw. You have shortness of breath at any time. You suddenly start to sweat, or your skin gets clammy. You feel nausea or you vomit. You suddenly feel lightheaded or dizzy. You have severe weakness, or unexplained weakness or fatigue. Your heart begins to beat quickly, or it feels like it is skipping beats.

## 2021-01-27 NOTE — ED Triage Notes (Signed)
Patient is being discharged from the Urgent Care and sent to the Emergency Department via ems . Per Dr Gordy Clement , patient is in need of higher level of care due to chest pain, abnormal ekg, pain not relieved with gi cocktail . Patient is aware and verbalizes understanding of plan of care.  Vitals:   01/27/21 1240  BP: (!) 174/74  Pulse: 82  Resp: 20  Temp: 98.2 F (36.8 C)  SpO2: 95%

## 2021-01-27 NOTE — ED Provider Notes (Signed)
Medical screening examination/treatment/procedure(s) were conducted as a shared visit with non-physician practitioner(s) and myself.  I personally evaluated the patient during the encounter.  Clinical Impression:   Final diagnoses:  Precordial chest pain  COVID  Hyponatremia     This patient is a 87 year old female presenting with left-sided chest pain, seems to be in the upper chest, has been rather persistent for the last couple of days, not associated with coughing at this time, she has recently had fever and COVID but has been improving.  On exam the patient has clear lungs, clear heart sounds, no pneumonia, no rash to the chest, no rales, no edema of the legs, otherwise appears to be in no distress.  The patient's EKG shows left axis deviation but otherwise unremarkable.  She is COVID-positive, will confirm no pneumonia and no pulmonary embolism given this recent increasing pain.    Noemi Chapel, MD 01/28/21 [phone]

## 2021-01-27 NOTE — ED Triage Notes (Signed)
Pt presents with left side chest pain for past 2 days 6/10 pt poor historian and is unable to describe , provider made aware

## 2021-01-27 NOTE — ED Notes (Signed)
Put pt on a pure wick. 

## 2021-01-27 NOTE — ED Provider Notes (Signed)
[facility] EMERGENCY DEPARTMENT Provider Note   CSN: [id number] Arrival date & time: 01/27/21  1329     History Chief Complaint  Patient presents with  . Chest Pain    CP/ COVID over a week ago    [name] is a 87 y.o. female with a history of diabetes, stroke, mild cognitive impairment, hypertension, GERD, hyperlipidemia.  Patient presents with a chief complaint of left-sided chest pain.  Patient notes that this pain over the last 2 days.  Patient reports pain has been constant over this time.  Patient describes her pain as a pressure.  Patient rates pain 5/10 on the pain scale.  Patient denies any associated nausea, vomiting, shortness of breath or diaphoresis.  Patient denies any alleviating or aggravating factors.  Patient denies any fever, chills, rhinorrhea, nasal congestion, sore throat, cough, shortness of breath, leg swelling, palpitations, abdominal pain, dysuria, hematuria, back pain, neck pain, syncope, near syncope, dizziness.  Patient denies any hemoptysis, history of PE or DVT, hormone therapy, surgery last 12 weeks, history of cancer, unilateral leg swelling or tenderness.    Patient tested positive for COVID-19 on April 28.  Patient has been fully vaccinated and received 1 booster.  HPI     Past Medical History:  Diagnosis Date  . Diabetes mellitus without complication ([place])   . Stroke [facility])     Patient Active Problem List   Diagnosis Date Noted  . DEGENERATIVE DISC DISEASE, LUMBAR SPINE 07/21/2009    Past Surgical History:  Procedure Laterality Date  . CHOLECYSTECTOMY       OB History   No obstetric history on file.     Family History  Problem Relation Age of Onset  . Healthy Mother   . Healthy Father     Social History   Tobacco Use  . Smoking status: Never Smoker  . Smokeless tobacco: Never Used    Home Medications Prior to Admission medications   Medication Sig Start Date End Date Taking? Authorizing Provider  ALPRAZolam  (XANAX) 0.25 MG tablet Take 0.25 mg by mouth.    [provider]  aspirin EC 81 MG tablet Take 162 mg by mouth.    [provider]  benzonatate (TESSALON) 100 MG capsule Take 1 capsule (100 mg total) by mouth 3 (three) times daily as needed for cough. 01/19/21   Scot Jun, FNP  donepezil (ARICEPT) 5 MG tablet Take 5 mg by mouth daily. 01/11/21   [provider]  doxycycline (VIBRAMYCIN) 100 MG capsule Take 1 capsule (100 mg total) by mouth 2 (two) times daily. 01/19/21   Scot Jun, FNP  ezetimibe (ZETIA) 10 MG tablet Take by mouth.    [provider]  glipiZIDE (GLUCOTROL XL) 2.5 MG 24 hr tablet Take 2.5 mg by mouth.    [provider]  JANUVIA 100 MG tablet Take 100 mg by mouth daily. 01/09/21   [provider]  losartan-hydrochlorothiazide (HYZAAR) 50-12.5 MG tablet Take 1 tablet by mouth.    [provider]  memantine (NAMENDA) 5 MG tablet Take 5 mg by mouth 2 (two) times daily. 12/29/20   [provider]  omeprazole (PRILOSEC) 20 MG capsule Take 20 mg by mouth.    [provider]  ranitidine (ZANTAC) 150 MG capsule Take 150 mg by mouth.    [provider]  TRADJENTA 5 MG TABS tablet Take 5 mg by mouth daily. 09/17/15   [provider]  traMADol (ULTRAM) 50 MG tablet Take  50 mg by mouth.    [provider]    Allergies    Patient has no known allergies.  Review of Systems   Review of Systems  Constitutional: Negative for chills and fever.  HENT: Negative for congestion, rhinorrhea and sore throat.   Eyes: Negative for visual disturbance.  Respiratory: Negative for cough and shortness of breath.   Cardiovascular: Positive for chest pain. Negative for palpitations and leg swelling.  Gastrointestinal: Negative for abdominal pain, diarrhea, nausea and vomiting.  Genitourinary: Negative for difficulty urinating, dysuria, hematuria and urgency.  Musculoskeletal: Negative  for back pain and neck pain.  Skin: Negative for color change and rash.  Neurological: Negative for dizziness, tremors, seizures, syncope, facial asymmetry, speech difficulty, weakness, light-headedness, numbness and headaches.  Psychiatric/Behavioral: Negative for confusion.    Physical Exam Updated Vital Signs BP (!) 178/78 (BP Location: Right Arm)   Pulse 82   Temp 98.3 F (36.8 C) (Oral)   Resp (!) 22   Ht 5' (1.524 m)   Wt 72.1 kg   SpO2 99%   BMI 31.05 kg/m   Physical Exam Vitals and nursing note reviewed.  Constitutional:      General: She is not in acute distress.    Appearance: She is not ill-appearing, toxic-appearing or diaphoretic.  HENT:     Head: Normocephalic.  Eyes:     General: No scleral icterus.       Right eye: No discharge.        Left eye: No discharge.  Cardiovascular:     Rate and Rhythm: Normal rate.     Heart sounds: Normal heart sounds.  Pulmonary:     Effort: Pulmonary effort is normal. No tachypnea, bradypnea or respiratory distress.     Breath sounds: Normal breath sounds. No stridor.  Abdominal:     General: Abdomen is flat. There is no distension. There are no signs of injury.     Palpations: Abdomen is soft. There is no mass or pulsatile mass.     Tenderness: There is no abdominal tenderness. There is no guarding or rebound.     Hernia: There is no hernia in the umbilical area or ventral area.  Musculoskeletal:     Cervical back: Neck supple.     Right lower leg: No swelling or tenderness. No edema.     Left lower leg: No swelling or tenderness. No edema.  Skin:    General: Skin is warm and dry.     Coloration: Skin is not cyanotic or pale.  Neurological:     General: No focal deficit present.     Mental Status: She is alert and oriented to person, place, and time.     GCS: GCS eye subscore is 4. GCS verbal subscore is 5. GCS motor subscore is 6.     Comments: Patient moves all extremities equally without difficulty  Psychiatric:         Behavior: Behavior is cooperative.     ED Results / Procedures / Treatments   Labs (all labs ordered are listed, but only abnormal results are displayed) Labs Reviewed  RESP PANEL BY RT-PCR (FLU A&B, COVID) ARPGX2 - Abnormal; Notable for the following components:      Result Value   SARS Coronavirus 2 by RT PCR POSITIVE (*)    All other components within normal limits  CBC WITH DIFFERENTIAL/PLATELET - Abnormal; Notable for the following components:   Abs Immature Granulocytes 0.08 (*)    All other components within  normal limits  COMPREHENSIVE METABOLIC PANEL - Abnormal; Notable for the following components:   Sodium 129 (*)    Chloride 92 (*)    Glucose, Bld 105 (*)    Total Protein 6.2 (*)    All other components within normal limits  TROPONIN I (HIGH SENSITIVITY)  TROPONIN I (HIGH SENSITIVITY)    EKG EKG Interpretation  Date/Time:  Friday Jan 27 2021 14:31:57 EDT Ventricular Rate:  72 PR Interval:  217 QRS Duration: 81 QT Interval:  406 QTC Calculation: 445 R Axis:   -69 Text Interpretation: Sinus or ectopic atrial rhythm Borderline prolonged PR interval Inferolateral infarct, old since last tracing no significant change Confirmed by Noemi Chapel [phone]) on 01/27/2021 4:09:20 PM   Radiology CT Angio Chest PE W and/or Wo Contrast  Result Date: 01/27/2021 CLINICAL DATA:  Left-sided chest pain for 2 days, COVID-19 positivity EXAM: CT ANGIOGRAPHY CHEST WITH CONTRAST TECHNIQUE: Multidetector CT imaging of the chest was performed using the standard protocol during bolus administration of intravenous contrast. Multiplanar CT image reconstructions and MIPs were obtained to evaluate the vascular anatomy. CONTRAST:  145mL OMNIPAQUE IOHEXOL 350 MG/ML SOLN COMPARISON:  Chest x-ray from earlier in the same day. FINDINGS: Cardiovascular: Thoracic aorta and its branches demonstrate atherosclerotic calcification. No aneurysmal dilatation or dissection is noted. No cardiac enlargement  is seen. Coronary calcifications are noted. Pulmonary artery is well visualized and demonstrates a normal branching pattern. No filling defect to suggest pulmonary embolism is identified. Mediastinum/Nodes: Thoracic inlet is within normal limits. No sizable hilar or mediastinal adenopathy is noted. The esophagus as visualized is within normal limits. Lungs/Pleura: Lungs are well aerated bilaterally. No focal infiltrate or sizable effusion is seen. No sizable parenchymal nodule is seen. Upper Abdomen: Gallbladder has been surgically removed. No other focal abnormality is noted. Musculoskeletal: Degenerative changes of the thoracic spine are seen. No acute rib abnormality is noted. Review of the MIP images confirms the above findings. IMPRESSION: No evidence of pulmonary emboli. No focal infiltrate is seen. Aortic Atherosclerosis (ICD10-I70.0). Electronically Signed   By: Inez Catalina M.D.   On: 01/27/2021 17:33   DG Chest Port 1 View  Result Date: 01/27/2021 CLINICAL DATA:  Left-sided chest pain for 2 days. EXAM: PORTABLE CHEST 1 VIEW COMPARISON:  Radiographs 02/22/2016 and 08/13/2012. Right shoulder radiographs 09/26/2019. FINDINGS: 1423 hours. The heart size and mediastinal contours are stable with aortic atherosclerosis. There is mild chronic interstitial prominence which appears similar to prior radiographs. No edema, confluent airspace opacity, pleural effusion or pneumothorax identified. The bones appear unremarkable. IMPRESSION: No evidence of acute cardiopulmonary process. Mild chronic interstitial lung disease and aortic atherosclerosis. Electronically Signed   By: Richardean Sale M.D.   On: 01/27/2021 14:55    Procedures Procedures   Medications Ordered in ED Medications  alum & mag hydroxide-simeth (MAALOX/MYLANTA) 200-200-20 MG/5ML suspension 30 mL (30 mLs Oral Given 01/27/21 1609)    And  lidocaine (XYLOCAINE) 2 % viscous mouth solution 15 mL (15 mLs Oral Given 01/27/21 1609)  sodium chloride  0.9 % bolus 1,000 mL (0 mLs Intravenous Stopped 01/27/21 1748)  iohexol (OMNIPAQUE) 350 MG/ML injection 100 mL (100 mLs Intravenous Contrast Given 01/27/21 1654)    ED Course  I have reviewed the triage vital signs and the nursing notes.  Pertinent labs & imaging results that were available during my care of the patient were reviewed by me and considered in my medical decision making (see chart for details).    MDM Rules/Calculators/A&P  Alert 87 year old female no acute stress, nontoxic-appearing.  Patient presents with chief complaint of left-sided chest pain.  Pain has been present over the last 2 days.  Pain has been constant over this time.  Patient describes it as pressure.  No radiation of pain.  No associated nausea, vomiting, diaphoresis or shortness of breath.  Patient has history of diabetes, hypertension, hyperlipidemia, and previous stroke.  Heart score 5.  Due to patient's elevated risk factors and complaints of left-sided chest pain will work patient up for ACS.  Chest x-ray shows no cute cardiopulmonary disease. EKG shows Sinus or ectopic atrial rhythm Borderline prolonged PR interval; no significant change from previous tracing. Troponin 15 and 15.  Delta of 0 Low suspicion for ACS as patient has had constant chest pain for greater than 10 hours negative troponin.  Patient is a positive COVID-19 on April 28.  Due to her chest pain and positive COVID-19 status concern for possible PE.  We will obtain a CTA chest to evaluate.  CBC is unremarkable. CMP shows sodium decreased at 139, chloride decreased at 92.  We will give patient 1 L fluid bolus to help replete these electrolytes.  CTA shows no evidence of pulmonary emboli or focal infiltrate.  On serial reassessment patient in no acute distress.  Patient denies any change to her pain after receiving GI cocktail.    Patient hemodynamically stable at this time.  Will discharge patient and have her  follow-up with primary care provider in 1 week to reassess sodium levels.  Discussed results, findings, treatment and follow up. Patient advised of return precautions. Patient verbalized understanding and agreed with plan.   Final Clinical Impression(s) / ED Diagnoses Final diagnoses:  Precordial chest pain  COVID  Hyponatremia    Rx / DC Orders ED Discharge Orders    None       Dyann Ruddle 01/27/21 1807    Noemi Chapel, MD 01/28/21 [phone]

## 2021-01-27 NOTE — ED Provider Notes (Signed)
HPI  SUBJECTIVE:  [name] is a 87 y.o. female who presents with 2 days of throbbing, constant left-sided chest pain.  She reports increased belching, cough.  It does not radiate up her neck, down her arm or through to her back.  No nausea, diaphoresis, waterbrash.  No exertional or positional component.  No palpitations, lightheadedness, dizziness, syncope.  No fevers, wheezing, shortness of breath.  No calf pain or swelling.  No trauma to the chest, change in her physical activity.  She has never had symptoms like this before.  She just recovered from COVID 10 days ago.  She has tried Tums without improvement in her symptoms.  There are no aggravating or alleviating factors.  Past medical history also includes diabetes, hypertension, hypercholesterolemia, GERD.  No history of coronary artery disease, MI, smoking.  Family history negative for MI.  RCV:ELFY, Edwinna Areola, MD   Past Medical History:  Diagnosis Date  . Diabetes mellitus without complication ([place])   . Stroke [facility])     Past Surgical History:  Procedure Laterality Date  . CHOLECYSTECTOMY      Family History  Problem Relation Age of Onset  . Healthy Mother   . Healthy Father     Social History   Tobacco Use  . Smoking status: Never Smoker  . Smokeless tobacco: Never Used    No current facility-administered medications for this encounter.  Current Outpatient Medications:  .  ALPRAZolam (XANAX) 0.25 MG tablet, Take 0.25 mg by mouth., Disp: , Rfl:  .  aspirin EC 81 MG tablet, Take 162 mg by mouth., Disp: , Rfl:  .  benzonatate (TESSALON) 100 MG capsule, Take 1 capsule (100 mg total) by mouth 3 (three) times daily as needed for cough., Disp: 40 capsule, Rfl: 0 .  donepezil (ARICEPT) 5 MG tablet, Take 5 mg by mouth daily., Disp: , Rfl:  .  doxycycline (VIBRAMYCIN) 100 MG capsule, Take 1 capsule (100 mg total) by mouth 2 (two) times daily., Disp: 20 capsule, Rfl: 0 .  ezetimibe (ZETIA) 10 MG tablet, Take by mouth.,  Disp: , Rfl:  .  glipiZIDE (GLUCOTROL XL) 2.5 MG 24 hr tablet, Take 2.5 mg by mouth., Disp: , Rfl:  .  JANUVIA 100 MG tablet, Take 100 mg by mouth daily., Disp: , Rfl:  .  losartan-hydrochlorothiazide (HYZAAR) 50-12.5 MG tablet, Take 1 tablet by mouth., Disp: , Rfl:  .  memantine (NAMENDA) 5 MG tablet, Take 5 mg by mouth 2 (two) times daily., Disp: , Rfl:  .  omeprazole (PRILOSEC) 20 MG capsule, Take 20 mg by mouth., Disp: , Rfl:  .  ranitidine (ZANTAC) 150 MG capsule, Take 150 mg by mouth., Disp: , Rfl:  .  TRADJENTA 5 MG TABS tablet, Take 5 mg by mouth daily., Disp: , Rfl: 0 .  traMADol (ULTRAM) 50 MG tablet, Take 50 mg by mouth., Disp: , Rfl:   No Known Allergies   ROS  As noted in HPI.   Physical Exam  BP (!) 174/74   Pulse 82   Temp 98.2 F (36.8 C)   Resp 20   SpO2 95%   Constitutional: Well developed, well nourished, no acute distress Eyes:  EOMI, conjunctiva normal bilaterally HENT: Normocephalic, atraumatic,mucus membranes moist Respiratory: Normal inspiratory effort, lungs clear bilaterally Cardiovascular: Normal rate, regular rhythm, no murmurs rubs or gallops.  No chest wall tenderness GI: nondistended skin: No rash, skin intact Musculoskeletal: Trace edema bilateral calves.  No tenderness, palpable cord. Neurologic: Alert & oriented  x 3, no focal neuro deficits Psychiatric: Speech and behavior appropriate   ED Course   Medications - No data to display  No orders of the defined types were placed in this encounter.   No results found for this or any previous visit (from the past 24 hour(s)). No results found.  ED Clinical Impression  1. Chest pain, unspecified type      ED Assessment/Plan  EKG: Normal sinus rhythm, rate 78.  Left axis deviation.  Normal intervals.  No hypertrophy.  No ST elevation.  Q waves inferior leads present on previous EKG from 2017.  She was symptomatic while EKG was obtained.  Patient with multiple cardiac risk factors and  recently recovered from [place].  Concern for ACS.  There is no evidence of pericarditis on EKG.  In the differential, but much lower is pneumonia, PE, myocarditis.  Transferring to the emergency department for further evaluation via EMS for cardiac monitoring on route.  Report given by staff.  She was given a GI cocktail without improvement in her symptoms.  No orders of the defined types were placed in this encounter.   *This clinic note was created using Dragon dictation software. Therefore, there may be occasional mistakes despite careful proofreading.  ?    Melynda Ripple, MD 01/27/21 1258

## 2021-02-21 DIAGNOSIS — I10 Essential (primary) hypertension: Secondary | ICD-10-CM | POA: Diagnosis not present

## 2021-02-21 DIAGNOSIS — E1165 Type 2 diabetes mellitus with hyperglycemia: Secondary | ICD-10-CM | POA: Diagnosis not present

## 2021-02-27 DIAGNOSIS — I10 Essential (primary) hypertension: Secondary | ICD-10-CM | POA: Diagnosis not present

## 2021-02-27 DIAGNOSIS — E1165 Type 2 diabetes mellitus with hyperglycemia: Secondary | ICD-10-CM | POA: Diagnosis not present

## 2021-02-27 DIAGNOSIS — R419 Unspecified symptoms and signs involving cognitive functions and awareness: Secondary | ICD-10-CM | POA: Diagnosis not present

## 2021-02-27 DIAGNOSIS — N3281 Overactive bladder: Secondary | ICD-10-CM | POA: Diagnosis not present

## 2021-02-27 DIAGNOSIS — F5104 Psychophysiologic insomnia: Secondary | ICD-10-CM | POA: Diagnosis not present

## 2021-02-27 DIAGNOSIS — E782 Mixed hyperlipidemia: Secondary | ICD-10-CM | POA: Diagnosis not present

## 2021-02-27 DIAGNOSIS — M545 Low back pain, unspecified: Secondary | ICD-10-CM | POA: Diagnosis not present

## 2021-02-27 DIAGNOSIS — G609 Hereditary and idiopathic neuropathy, unspecified: Secondary | ICD-10-CM | POA: Diagnosis not present

## 2021-02-27 DIAGNOSIS — L989 Disorder of the skin and subcutaneous tissue, unspecified: Secondary | ICD-10-CM | POA: Diagnosis not present

## 2021-02-27 DIAGNOSIS — K219 Gastro-esophageal reflux disease without esophagitis: Secondary | ICD-10-CM | POA: Diagnosis not present

## 2021-06-01 ENCOUNTER — Other Ambulatory Visit: Payer: Self-pay

## 2021-06-01 ENCOUNTER — Ambulatory Visit: Payer: PPO | Admitting: Physician Assistant

## 2021-06-01 DIAGNOSIS — D485 Neoplasm of uncertain behavior of skin: Secondary | ICD-10-CM

## 2021-06-01 DIAGNOSIS — Z1283 Encounter for screening for malignant neoplasm of skin: Secondary | ICD-10-CM | POA: Diagnosis not present

## 2021-06-01 DIAGNOSIS — C4491 Basal cell carcinoma of skin, unspecified: Secondary | ICD-10-CM

## 2021-06-01 DIAGNOSIS — L578 Other skin changes due to chronic exposure to nonionizing radiation: Secondary | ICD-10-CM

## 2021-06-01 DIAGNOSIS — Z85828 Personal history of other malignant neoplasm of skin: Secondary | ICD-10-CM | POA: Diagnosis not present

## 2021-06-01 DIAGNOSIS — C44310 Basal cell carcinoma of skin of unspecified parts of face: Secondary | ICD-10-CM

## 2021-06-01 DIAGNOSIS — L821 Other seborrheic keratosis: Secondary | ICD-10-CM | POA: Diagnosis not present

## 2021-06-01 DIAGNOSIS — C44319 Basal cell carcinoma of skin of other parts of face: Secondary | ICD-10-CM | POA: Diagnosis not present

## 2021-06-01 DIAGNOSIS — D1800 Hemangioma unspecified site: Secondary | ICD-10-CM

## 2021-06-01 DIAGNOSIS — L814 Other melanin hyperpigmentation: Secondary | ICD-10-CM | POA: Diagnosis not present

## 2021-06-01 DIAGNOSIS — D229 Melanocytic nevi, unspecified: Secondary | ICD-10-CM | POA: Diagnosis not present

## 2021-06-01 DIAGNOSIS — C44311 Basal cell carcinoma of skin of nose: Secondary | ICD-10-CM | POA: Diagnosis not present

## 2021-06-01 HISTORY — DX: Basal cell carcinoma of skin, unspecified: C44.91

## 2021-06-01 NOTE — Patient Instructions (Signed)

## 2021-06-06 ENCOUNTER — Encounter: Payer: Self-pay | Admitting: Physician Assistant

## 2021-06-06 NOTE — Progress Notes (Signed)
   New Patient   Subjective  [name] is a 87 y.o. female who presents for the following: Skin Problem (Here to have some lesions on her face looked at. Some are crusty and not going away. Some do itch. History of non mole skin cancers. ).   The following portions of the chart were reviewed this encounter and updated as appropriate:  Tobacco  Allergies  Meds  Problems  Med Hx  Surg Hx  Fam Hx      Objective  Well appearing patient in no apparent distress; mood and affect are within normal limits.  A full examination was performed including scalp, head, eyes, ears, nose, lips, neck, chest, axillae, abdomen, back, buttocks, bilateral upper extremities, bilateral lower extremities, hands, feet, fingers, toes, fingernails, and toenails. All findings within normal limits unless otherwise noted below.  Left Nasal Sidewall Pearly papule with arborizing vessels.       Assessment & Plan  BCC (basal cell carcinoma), face Left Nasal Sidewall  Skin / nail biopsy Type of biopsy: tangential   Informed consent: discussed and consent obtained   Timeout: patient name, date of birth, surgical site, and procedure verified   Anesthesia: the lesion was anesthetized in a standard fashion   Anesthetic:  1% lidocaine w/ epinephrine 1-100,000 local infiltration Instrument used: flexible razor blade   Hemostasis achieved with: aluminum chloride and electrodesiccation   Outcome: patient tolerated procedure well   Post-procedure details: wound care instructions given    Destruction of lesion Complexity: simple   Destruction method: electrodesiccation and curettage   Informed consent: discussed and consent obtained   Timeout:  patient name, date of birth, surgical site, and procedure verified Anesthesia: the lesion was anesthetized in a standard fashion   Anesthetic:  1% lidocaine w/ epinephrine 1-100,000 local infiltration Curettage performed in three different directions: Yes    Electrodesiccation performed over the curetted area: Yes   Curettage cycles:  3 Margin per side (cm):  0.1 Final wound size (cm):  1 Hemostasis achieved with:  aluminum chloride Outcome: patient tolerated procedure well with no complications   Post-procedure details: wound care instructions given    Specimen 1 - Surgical pathology Differential Diagnosis: bcc scc tx with bx   Check Margins: No   Lentigines - Scattered tan macules - Discussed due to sun exposure - Benign, observe - Call for any changes  Seborrheic Keratoses - Stuck-on, waxy, tan-brown papules and plaques  - Discussed benign etiology and prognosis. - Observe - Call for any changes  Melanocytic Nevi - Tan-brown and/or pink-flesh-colored symmetric macules and papules - Benign appearing on exam today - Observation - Call clinic for new or changing moles - Recommend daily use of broad spectrum spf 30+ sunscreen to sun-exposed areas.   Hemangiomas - Red papules - Discussed benign nature - Observe - Call for any changes  Actinic Damage - diffuse scaly erythematous macules with underlying dyspigmentation - Recommend daily broad spectrum sunscreen SPF 30+ to sun-exposed areas, reapply every 2 hours as needed.  - Call for new or changing lesions.  Skin cancer screening performed today.   I, Tayllor Breitenstein, PA-C, have reviewed all documentation's for this visit.  The documentation on 06/06/21 for the exam, diagnosis, procedures and orders are all accurate and complete.

## 2021-06-07 ENCOUNTER — Encounter: Payer: Self-pay | Admitting: Physician Assistant

## 2021-06-07 ENCOUNTER — Telehealth: Payer: Self-pay | Admitting: *Deleted

## 2021-06-07 ENCOUNTER — Telehealth: Payer: Self-pay | Admitting: Physician Assistant

## 2021-06-07 NOTE — Telephone Encounter (Signed)
I tried calling husband phone number - to let him know that he is not on our HIPPA form- no answer and unable to leave message because voicemail full. Tried calling patient home number - busy x 2.

## 2021-06-07 NOTE — Telephone Encounter (Signed)
Patient left message on office voice mail that she did not understand the pathology results from her last visit with Robyne Askew, PA-C, so would like for her son, Leila Grothe to be call to be given results.  Marcello Moores' phone number is [phone].

## 2021-06-07 NOTE — Telephone Encounter (Signed)
Patient's son returned call and will bring [name] to sign the [employer].

## 2021-06-07 NOTE — Telephone Encounter (Signed)
Phone call from patient with her daughter in law Annyssa Moffit on the phone for her pathology results. Patient gave a verbal for me to give her daughter in law the results from her pathology report.  Pathology results given to patient's daughter in law.

## 2021-06-07 NOTE — Telephone Encounter (Signed)
-----   Message from Warren Danes, [place] sent at 06/06/2021  3:57 PM EDT ----- Return to clinic if recurs.

## 2021-06-07 NOTE — Telephone Encounter (Signed)
Pathology to patient- told to return to clinic if reoccurs.

## 2021-07-07 DIAGNOSIS — E1165 Type 2 diabetes mellitus with hyperglycemia: Secondary | ICD-10-CM | POA: Diagnosis not present

## 2021-07-11 DIAGNOSIS — E782 Mixed hyperlipidemia: Secondary | ICD-10-CM | POA: Diagnosis not present

## 2021-07-11 DIAGNOSIS — I10 Essential (primary) hypertension: Secondary | ICD-10-CM | POA: Diagnosis not present

## 2021-07-11 DIAGNOSIS — N3281 Overactive bladder: Secondary | ICD-10-CM | POA: Diagnosis not present

## 2021-07-11 DIAGNOSIS — R419 Unspecified symptoms and signs involving cognitive functions and awareness: Secondary | ICD-10-CM | POA: Diagnosis not present

## 2021-07-11 DIAGNOSIS — L989 Disorder of the skin and subcutaneous tissue, unspecified: Secondary | ICD-10-CM | POA: Diagnosis not present

## 2021-07-11 DIAGNOSIS — M545 Low back pain, unspecified: Secondary | ICD-10-CM | POA: Diagnosis not present

## 2021-07-11 DIAGNOSIS — F5104 Psychophysiologic insomnia: Secondary | ICD-10-CM | POA: Diagnosis not present

## 2021-07-11 DIAGNOSIS — K219 Gastro-esophageal reflux disease without esophagitis: Secondary | ICD-10-CM | POA: Diagnosis not present

## 2021-07-11 DIAGNOSIS — E1165 Type 2 diabetes mellitus with hyperglycemia: Secondary | ICD-10-CM | POA: Diagnosis not present

## 2021-07-11 DIAGNOSIS — Z23 Encounter for immunization: Secondary | ICD-10-CM | POA: Diagnosis not present

## 2021-07-11 DIAGNOSIS — G609 Hereditary and idiopathic neuropathy, unspecified: Secondary | ICD-10-CM | POA: Diagnosis not present

## 2021-07-11 DIAGNOSIS — M791 Myalgia, unspecified site: Secondary | ICD-10-CM | POA: Diagnosis not present

## 2021-11-03 DIAGNOSIS — E1165 Type 2 diabetes mellitus with hyperglycemia: Secondary | ICD-10-CM | POA: Diagnosis not present

## 2021-11-08 DIAGNOSIS — L989 Disorder of the skin and subcutaneous tissue, unspecified: Secondary | ICD-10-CM | POA: Diagnosis not present

## 2021-11-08 DIAGNOSIS — I10 Essential (primary) hypertension: Secondary | ICD-10-CM | POA: Diagnosis not present

## 2021-11-08 DIAGNOSIS — E782 Mixed hyperlipidemia: Secondary | ICD-10-CM | POA: Diagnosis not present

## 2021-11-08 DIAGNOSIS — G609 Hereditary and idiopathic neuropathy, unspecified: Secondary | ICD-10-CM | POA: Diagnosis not present

## 2021-11-08 DIAGNOSIS — M791 Myalgia, unspecified site: Secondary | ICD-10-CM | POA: Diagnosis not present

## 2021-11-08 DIAGNOSIS — M545 Low back pain, unspecified: Secondary | ICD-10-CM | POA: Diagnosis not present

## 2021-11-08 DIAGNOSIS — N3281 Overactive bladder: Secondary | ICD-10-CM | POA: Diagnosis not present

## 2021-11-08 DIAGNOSIS — R419 Unspecified symptoms and signs involving cognitive functions and awareness: Secondary | ICD-10-CM | POA: Diagnosis not present

## 2021-11-08 DIAGNOSIS — F5104 Psychophysiologic insomnia: Secondary | ICD-10-CM | POA: Diagnosis not present

## 2021-11-08 DIAGNOSIS — E1165 Type 2 diabetes mellitus with hyperglycemia: Secondary | ICD-10-CM | POA: Diagnosis not present

## 2021-11-08 DIAGNOSIS — K219 Gastro-esophageal reflux disease without esophagitis: Secondary | ICD-10-CM | POA: Diagnosis not present

## 2021-11-20 DIAGNOSIS — J069 Acute upper respiratory infection, unspecified: Secondary | ICD-10-CM | POA: Diagnosis not present

## 2021-11-29 ENCOUNTER — Other Ambulatory Visit: Payer: Self-pay

## 2021-11-29 ENCOUNTER — Ambulatory Visit: Payer: PPO | Admitting: Physician Assistant

## 2021-11-29 ENCOUNTER — Encounter: Payer: Self-pay | Admitting: Physician Assistant

## 2021-11-29 DIAGNOSIS — I781 Nevus, non-neoplastic: Secondary | ICD-10-CM | POA: Diagnosis not present

## 2021-11-29 DIAGNOSIS — Z85828 Personal history of other malignant neoplasm of skin: Secondary | ICD-10-CM

## 2021-11-29 DIAGNOSIS — C44311 Basal cell carcinoma of skin of nose: Secondary | ICD-10-CM

## 2021-11-29 DIAGNOSIS — D485 Neoplasm of uncertain behavior of skin: Secondary | ICD-10-CM

## 2021-11-29 NOTE — Patient Instructions (Signed)

## 2021-11-30 ENCOUNTER — Encounter: Payer: Self-pay | Admitting: Physician Assistant

## 2021-11-30 NOTE — Progress Notes (Signed)
° °  Follow-Up Visit   Subjective  [name] is a 88 y.o. female who presents for the following: Follow-up (Recheck left nasal sidewall- treated a bcc last office visit. She has a recurrent bump on her nose currently.No symptoms. Personal history of non mole skin cancer, but no melanoma. ).   The following portions of the chart were reviewed this encounter and updated as appropriate:  Tobacco   Allergies   Meds   Problems   Med Hx   Surg Hx   Fam Hx       Objective  Well appearing patient in no apparent distress; mood and affect are within normal limits.  A focused examination was performed including face. Relevant physical exam findings are noted in the Assessment and Plan.  Left Nasal Sidewall Pearly papule with telangectasia inside previously treated BCC.    Assessment & Plan  Neoplasm of uncertain behavior of skin Left Nasal Sidewall  Skin / nail biopsy Type of biopsy: punch   Informed consent: discussed and consent obtained   Timeout: patient name, date of birth, surgical site, and procedure verified   Procedure prep:  Patient was prepped and draped in usual sterile fashion (Non sterile) Prep type:  Chlorhexidine Anesthesia: the lesion was anesthetized in a standard fashion   Anesthetic:  1% lidocaine w/ epinephrine 1-100,000 local infiltration Punch size:  4 mm Suture size:  4-0 Suture type: nylon   Suture removal (days):  9 Hemostasis achieved with: suture   Outcome: patient tolerated procedure well   Additional details:  4-0 Ethilon x 2  Specimen 1 - Surgical pathology Differential Diagnosis: bcc [id number]  Check Margins:yes    I, Dezra Mandella, PA-C, have reviewed all documentation's for this visit.  The documentation on 11/30/21 for the exam, diagnosis, procedures and orders are all accurate and complete.

## 2021-12-05 ENCOUNTER — Telehealth: Payer: Self-pay | Admitting: *Deleted

## 2021-12-05 NOTE — Telephone Encounter (Signed)
Phone call from patient's daughter in law returning our call. Pathology results given to patient's daughter in law.  ?

## 2021-12-05 NOTE — Addendum Note (Signed)
Addended by: Warren Danes on: 12/05/2021 08:23 AM ? ? Modules accepted: Orders ? ?

## 2021-12-05 NOTE — Telephone Encounter (Signed)
-----   Message from Warren Danes, [place] sent at 12/05/2021  8:04 AM EDT ----- ?Margins free. RTC if recurs.  ?

## 2021-12-05 NOTE — Telephone Encounter (Signed)
Left patient message to call back with results.  ?

## 2021-12-05 NOTE — Telephone Encounter (Signed)
-----   Message from Warren Danes, [place] sent at 12/05/2021  8:04 AM EDT ----- ?Margins Wanya [place]ura. RTC if recurs.  ?

## 2021-12-06 ENCOUNTER — Ambulatory Visit (INDEPENDENT_AMBULATORY_CARE_PROVIDER_SITE_OTHER): Payer: PPO

## 2021-12-06 ENCOUNTER — Other Ambulatory Visit: Payer: Self-pay

## 2021-12-06 DIAGNOSIS — Z4802 Encounter for removal of sutures: Secondary | ICD-10-CM

## 2021-12-06 NOTE — Progress Notes (Signed)
Pt here for s/r. Path to pt. Pt tolerated well.  ?

## 2022-03-15 DIAGNOSIS — E1165 Type 2 diabetes mellitus with hyperglycemia: Secondary | ICD-10-CM | POA: Diagnosis not present

## 2022-03-19 DIAGNOSIS — G609 Hereditary and idiopathic neuropathy, unspecified: Secondary | ICD-10-CM | POA: Diagnosis not present

## 2022-03-19 DIAGNOSIS — M791 Myalgia, unspecified site: Secondary | ICD-10-CM | POA: Diagnosis not present

## 2022-03-19 DIAGNOSIS — E1165 Type 2 diabetes mellitus with hyperglycemia: Secondary | ICD-10-CM | POA: Diagnosis not present

## 2022-03-19 DIAGNOSIS — I10 Essential (primary) hypertension: Secondary | ICD-10-CM | POA: Diagnosis not present

## 2022-03-19 DIAGNOSIS — M545 Low back pain, unspecified: Secondary | ICD-10-CM | POA: Diagnosis not present

## 2022-03-19 DIAGNOSIS — L989 Disorder of the skin and subcutaneous tissue, unspecified: Secondary | ICD-10-CM | POA: Diagnosis not present

## 2022-03-19 DIAGNOSIS — R419 Unspecified symptoms and signs involving cognitive functions and awareness: Secondary | ICD-10-CM | POA: Diagnosis not present

## 2022-03-19 DIAGNOSIS — E782 Mixed hyperlipidemia: Secondary | ICD-10-CM | POA: Diagnosis not present

## 2022-03-19 DIAGNOSIS — K219 Gastro-esophageal reflux disease without esophagitis: Secondary | ICD-10-CM | POA: Diagnosis not present

## 2022-03-19 DIAGNOSIS — F5104 Psychophysiologic insomnia: Secondary | ICD-10-CM | POA: Diagnosis not present

## 2022-03-19 DIAGNOSIS — N3281 Overactive bladder: Secondary | ICD-10-CM | POA: Diagnosis not present

## 2022-04-17 DIAGNOSIS — E119 Type 2 diabetes mellitus without complications: Secondary | ICD-10-CM | POA: Diagnosis not present

## 2022-07-13 DIAGNOSIS — E1165 Type 2 diabetes mellitus with hyperglycemia: Secondary | ICD-10-CM | POA: Diagnosis not present

## 2022-07-13 DIAGNOSIS — E782 Mixed hyperlipidemia: Secondary | ICD-10-CM | POA: Diagnosis not present

## 2022-07-19 DIAGNOSIS — K219 Gastro-esophageal reflux disease without esophagitis: Secondary | ICD-10-CM | POA: Diagnosis not present

## 2022-07-19 DIAGNOSIS — E782 Mixed hyperlipidemia: Secondary | ICD-10-CM | POA: Diagnosis not present

## 2022-07-19 DIAGNOSIS — Z23 Encounter for immunization: Secondary | ICD-10-CM | POA: Diagnosis not present

## 2022-07-19 DIAGNOSIS — M545 Low back pain, unspecified: Secondary | ICD-10-CM | POA: Diagnosis not present

## 2022-07-19 DIAGNOSIS — G609 Hereditary and idiopathic neuropathy, unspecified: Secondary | ICD-10-CM | POA: Diagnosis not present

## 2022-07-19 DIAGNOSIS — I10 Essential (primary) hypertension: Secondary | ICD-10-CM | POA: Diagnosis not present

## 2022-07-19 DIAGNOSIS — E118 Type 2 diabetes mellitus with unspecified complications: Secondary | ICD-10-CM | POA: Diagnosis not present

## 2022-07-19 DIAGNOSIS — Z0000 Encounter for general adult medical examination without abnormal findings: Secondary | ICD-10-CM | POA: Diagnosis not present

## 2022-07-19 DIAGNOSIS — L989 Disorder of the skin and subcutaneous tissue, unspecified: Secondary | ICD-10-CM | POA: Diagnosis not present

## 2022-07-19 DIAGNOSIS — F5104 Psychophysiologic insomnia: Secondary | ICD-10-CM | POA: Diagnosis not present

## 2022-07-19 DIAGNOSIS — R419 Unspecified symptoms and signs involving cognitive functions and awareness: Secondary | ICD-10-CM | POA: Diagnosis not present

## 2022-07-19 DIAGNOSIS — N3281 Overactive bladder: Secondary | ICD-10-CM | POA: Diagnosis not present

## 2022-12-04 DIAGNOSIS — E782 Mixed hyperlipidemia: Secondary | ICD-10-CM | POA: Diagnosis not present

## 2022-12-04 DIAGNOSIS — E118 Type 2 diabetes mellitus with unspecified complications: Secondary | ICD-10-CM | POA: Diagnosis not present

## 2022-12-10 DIAGNOSIS — E782 Mixed hyperlipidemia: Secondary | ICD-10-CM | POA: Diagnosis not present

## 2022-12-10 DIAGNOSIS — K219 Gastro-esophageal reflux disease without esophagitis: Secondary | ICD-10-CM | POA: Diagnosis not present

## 2022-12-10 DIAGNOSIS — M25571 Pain in right ankle and joints of right foot: Secondary | ICD-10-CM | POA: Diagnosis not present

## 2022-12-10 DIAGNOSIS — I10 Essential (primary) hypertension: Secondary | ICD-10-CM | POA: Diagnosis not present

## 2022-12-10 DIAGNOSIS — E1169 Type 2 diabetes mellitus with other specified complication: Secondary | ICD-10-CM | POA: Diagnosis not present

## 2022-12-10 DIAGNOSIS — E1140 Type 2 diabetes mellitus with diabetic neuropathy, unspecified: Secondary | ICD-10-CM | POA: Diagnosis not present

## 2022-12-10 DIAGNOSIS — E871 Hypo-osmolality and hyponatremia: Secondary | ICD-10-CM | POA: Diagnosis not present

## 2022-12-10 DIAGNOSIS — N3281 Overactive bladder: Secondary | ICD-10-CM | POA: Diagnosis not present

## 2022-12-10 DIAGNOSIS — G8929 Other chronic pain: Secondary | ICD-10-CM | POA: Diagnosis not present

## 2022-12-10 DIAGNOSIS — E118 Type 2 diabetes mellitus with unspecified complications: Secondary | ICD-10-CM | POA: Diagnosis not present

## 2022-12-10 DIAGNOSIS — M545 Low back pain, unspecified: Secondary | ICD-10-CM | POA: Diagnosis not present

## 2022-12-10 DIAGNOSIS — R4189 Other symptoms and signs involving cognitive functions and awareness: Secondary | ICD-10-CM | POA: Diagnosis not present

## 2022-12-11 ENCOUNTER — Ambulatory Visit: Payer: PPO | Admitting: Physician Assistant

## 2022-12-12 DIAGNOSIS — B078 Other viral warts: Secondary | ICD-10-CM | POA: Diagnosis not present

## 2022-12-12 DIAGNOSIS — X32XXXA Exposure to sunlight, initial encounter: Secondary | ICD-10-CM | POA: Diagnosis not present

## 2022-12-12 DIAGNOSIS — L570 Actinic keratosis: Secondary | ICD-10-CM | POA: Diagnosis not present

## 2022-12-12 DIAGNOSIS — L821 Other seborrheic keratosis: Secondary | ICD-10-CM | POA: Diagnosis not present

## 2023-01-24 DIAGNOSIS — D2262 Melanocytic nevi of left upper limb, including shoulder: Secondary | ICD-10-CM | POA: Diagnosis not present

## 2023-01-24 DIAGNOSIS — C44612 Basal cell carcinoma of skin of right upper limb, including shoulder: Secondary | ICD-10-CM | POA: Diagnosis not present

## 2023-03-07 DIAGNOSIS — Z08 Encounter for follow-up examination after completed treatment for malignant neoplasm: Secondary | ICD-10-CM | POA: Diagnosis not present

## 2023-03-07 DIAGNOSIS — Z85828 Personal history of other malignant neoplasm of skin: Secondary | ICD-10-CM | POA: Diagnosis not present

## 2023-04-11 DIAGNOSIS — F5104 Psychophysiologic insomnia: Secondary | ICD-10-CM | POA: Diagnosis not present

## 2023-04-11 DIAGNOSIS — G720 Drug-induced myopathy: Secondary | ICD-10-CM | POA: Diagnosis not present

## 2023-04-11 DIAGNOSIS — E118 Type 2 diabetes mellitus with unspecified complications: Secondary | ICD-10-CM | POA: Diagnosis not present

## 2023-04-11 DIAGNOSIS — G8929 Other chronic pain: Secondary | ICD-10-CM | POA: Diagnosis not present

## 2023-04-11 DIAGNOSIS — E871 Hypo-osmolality and hyponatremia: Secondary | ICD-10-CM | POA: Diagnosis not present

## 2023-04-11 DIAGNOSIS — E782 Mixed hyperlipidemia: Secondary | ICD-10-CM | POA: Diagnosis not present

## 2023-04-11 DIAGNOSIS — I10 Essential (primary) hypertension: Secondary | ICD-10-CM | POA: Diagnosis not present

## 2023-04-11 DIAGNOSIS — T466X5D Adverse effect of antihyperlipidemic and antiarteriosclerotic drugs, subsequent encounter: Secondary | ICD-10-CM | POA: Diagnosis not present

## 2023-04-11 DIAGNOSIS — G3184 Mild cognitive impairment, so stated: Secondary | ICD-10-CM | POA: Diagnosis not present

## 2023-04-11 DIAGNOSIS — E1142 Type 2 diabetes mellitus with diabetic polyneuropathy: Secondary | ICD-10-CM | POA: Diagnosis not present

## 2023-04-11 DIAGNOSIS — M545 Low back pain, unspecified: Secondary | ICD-10-CM | POA: Diagnosis not present

## 2023-04-11 DIAGNOSIS — E1169 Type 2 diabetes mellitus with other specified complication: Secondary | ICD-10-CM | POA: Diagnosis not present

## 2023-04-23 DIAGNOSIS — E119 Type 2 diabetes mellitus without complications: Secondary | ICD-10-CM | POA: Diagnosis not present

## 2023-06-04 DIAGNOSIS — B351 Tinea unguium: Secondary | ICD-10-CM | POA: Diagnosis not present

## 2023-06-04 DIAGNOSIS — E1151 Type 2 diabetes mellitus with diabetic peripheral angiopathy without gangrene: Secondary | ICD-10-CM | POA: Diagnosis not present

## 2023-08-06 DIAGNOSIS — E118 Type 2 diabetes mellitus with unspecified complications: Secondary | ICD-10-CM | POA: Diagnosis not present

## 2023-08-06 DIAGNOSIS — E782 Mixed hyperlipidemia: Secondary | ICD-10-CM | POA: Diagnosis not present

## 2023-08-12 DIAGNOSIS — E118 Type 2 diabetes mellitus with unspecified complications: Secondary | ICD-10-CM | POA: Diagnosis not present

## 2023-08-12 DIAGNOSIS — E871 Hypo-osmolality and hyponatremia: Secondary | ICD-10-CM | POA: Diagnosis not present

## 2023-08-12 DIAGNOSIS — G720 Drug-induced myopathy: Secondary | ICD-10-CM | POA: Diagnosis not present

## 2023-08-12 DIAGNOSIS — R419 Unspecified symptoms and signs involving cognitive functions and awareness: Secondary | ICD-10-CM | POA: Diagnosis not present

## 2023-08-12 DIAGNOSIS — M545 Low back pain, unspecified: Secondary | ICD-10-CM | POA: Diagnosis not present

## 2023-08-12 DIAGNOSIS — N3281 Overactive bladder: Secondary | ICD-10-CM | POA: Diagnosis not present

## 2023-08-12 DIAGNOSIS — G609 Hereditary and idiopathic neuropathy, unspecified: Secondary | ICD-10-CM | POA: Diagnosis not present

## 2023-08-12 DIAGNOSIS — E782 Mixed hyperlipidemia: Secondary | ICD-10-CM | POA: Diagnosis not present

## 2023-08-12 DIAGNOSIS — K219 Gastro-esophageal reflux disease without esophagitis: Secondary | ICD-10-CM | POA: Diagnosis not present

## 2023-08-12 DIAGNOSIS — I10 Essential (primary) hypertension: Secondary | ICD-10-CM | POA: Diagnosis not present

## 2023-08-12 DIAGNOSIS — E1165 Type 2 diabetes mellitus with hyperglycemia: Secondary | ICD-10-CM | POA: Diagnosis not present

## 2023-08-13 DIAGNOSIS — B351 Tinea unguium: Secondary | ICD-10-CM | POA: Diagnosis not present

## 2023-08-13 DIAGNOSIS — E1151 Type 2 diabetes mellitus with diabetic peripheral angiopathy without gangrene: Secondary | ICD-10-CM | POA: Diagnosis not present

## 2023-12-04 DIAGNOSIS — E782 Mixed hyperlipidemia: Secondary | ICD-10-CM | POA: Diagnosis not present

## 2023-12-04 DIAGNOSIS — E118 Type 2 diabetes mellitus with unspecified complications: Secondary | ICD-10-CM | POA: Diagnosis not present

## 2023-12-10 DIAGNOSIS — N3281 Overactive bladder: Secondary | ICD-10-CM | POA: Diagnosis not present

## 2023-12-10 DIAGNOSIS — E1165 Type 2 diabetes mellitus with hyperglycemia: Secondary | ICD-10-CM | POA: Diagnosis not present

## 2023-12-10 DIAGNOSIS — I10 Essential (primary) hypertension: Secondary | ICD-10-CM | POA: Diagnosis not present

## 2023-12-10 DIAGNOSIS — E118 Type 2 diabetes mellitus with unspecified complications: Secondary | ICD-10-CM | POA: Diagnosis not present

## 2023-12-10 DIAGNOSIS — G8929 Other chronic pain: Secondary | ICD-10-CM | POA: Diagnosis not present

## 2023-12-10 DIAGNOSIS — E1142 Type 2 diabetes mellitus with diabetic polyneuropathy: Secondary | ICD-10-CM | POA: Diagnosis not present

## 2023-12-10 DIAGNOSIS — G720 Drug-induced myopathy: Secondary | ICD-10-CM | POA: Diagnosis not present

## 2023-12-10 DIAGNOSIS — E1159 Type 2 diabetes mellitus with other circulatory complications: Secondary | ICD-10-CM | POA: Diagnosis not present

## 2023-12-10 DIAGNOSIS — E1169 Type 2 diabetes mellitus with other specified complication: Secondary | ICD-10-CM | POA: Diagnosis not present

## 2023-12-10 DIAGNOSIS — M545 Low back pain, unspecified: Secondary | ICD-10-CM | POA: Diagnosis not present

## 2023-12-10 DIAGNOSIS — E782 Mixed hyperlipidemia: Secondary | ICD-10-CM | POA: Diagnosis not present

## 2024-01-06 ENCOUNTER — Ambulatory Visit
Admission: EM | Admit: 2024-01-06 | Discharge: 2024-01-06 | Disposition: A | Discharge status: Home / Self Care | Attending: Family Medicine | Admitting: Family Medicine

## 2024-01-06 DIAGNOSIS — R0602 Shortness of breath: Secondary | ICD-10-CM | POA: Diagnosis not present

## 2024-01-06 DIAGNOSIS — J22 Unspecified acute lower respiratory infection: Secondary | ICD-10-CM | POA: Diagnosis not present

## 2024-01-06 DIAGNOSIS — R5383 Other fatigue: Secondary | ICD-10-CM

## 2024-01-06 DIAGNOSIS — R062 Wheezing: Secondary | ICD-10-CM | POA: Diagnosis not present

## 2024-01-06 MED ORDER — DOXYCYCLINE HYCLATE 100 MG PO CAPS: 100.0000 mg | ORAL_CAPSULE | Freq: Two times a day (BID) | ORAL | 0 refills | Status: AC

## 2024-01-06 MED ORDER — PREDNISONE 20 MG PO TABS: 20.0000 mg | ORAL_TABLET | Freq: Every day | ORAL | 0 refills | Status: AC

## 2024-01-06 MED ORDER — GUAIFENESIN ER 600 MG PO TB12: 600.0000 mg | ORAL_TABLET | Freq: Two times a day (BID) | ORAL | 0 refills | Status: AC

## 2024-01-06 MED ORDER — ALBUTEROL SULFATE (2.5 MG/3ML) 0.083% IN NEBU
2.5000 mg | INHALATION_SOLUTION | Freq: Once | RESPIRATORY_TRACT | Status: AC
  Administered 2024-01-06: 2.5 mg via RESPIRATORY_TRACT

## 2024-01-06 MED ORDER — ALBUTEROL SULFATE HFA 108 (90 BASE) MCG/ACT IN AERS: 2.0000 | INHALATION_SPRAY | RESPIRATORY_TRACT | 0 refills | Status: AC | PRN

## 2024-01-06 NOTE — Discharge Instructions (Signed)
 Take the prescribed medications as directed, drink plenty of fluids, make sure to be taking deep breaths and coughing up as much congestion as possible.  Follow-up with your primary care provider in the next few days for a recheck and go to the emergency department if worsening at any time

## 2024-01-06 NOTE — ED Triage Notes (Signed)
 Pt reports cough started Thursday evening as hoarseness and progressed into a cough over the next few days.

## 2024-01-06 NOTE — ED Provider Notes (Signed)
 [facility]    CSN: [id number] Arrival date & time: 01/06/24  1032      History   Chief Complaint No chief complaint on file.   HPI [name] is a 89 y.o. female.   Patient presenting today with over a week of progressively worsening cough, chest tightness, wheezing, fatigue, congestion.  Denies fever, chest pain, severe shortness of breath, abdominal pain, vomiting, diarrhea.  So far trying Mucinex with minimal relief.  No known history of chronic pulmonary disease.   Past Medical History:  Diagnosis Date   Diabetes mellitus without complication (HCC)    Nodular basal cell carcinoma (BCC) 06/01/2021   Left Nasal Sidewall (treated after biopsy)   Stroke [facility])     Patient Active Problem List   Diagnosis Date Noted   DEGENERATIVE DISC DISEASE, LUMBAR SPINE 07/21/2009    Past Surgical History:  Procedure Laterality Date   CHOLECYSTECTOMY      OB History   No obstetric history on file.      Home Medications    Prior to Admission medications   Medication Sig Start Date End Date Taking? Authorizing Provider  albuterol (VENTOLIN HFA) 108 (90 Base) MCG/ACT inhaler Inhale 2 puffs into the lungs every 4 (four) hours as needed. 01/06/24  Yes Corbin Dess, PA-C  doxycycline (VIBRAMYCIN) 100 MG capsule Take 1 capsule (100 mg total) by mouth 2 (two) times daily. 01/06/24  Yes Corbin Dess, PA-C  guaiFENesin (MUCINEX) 600 MG 12 hr tablet Take 1 tablet (600 mg total) by mouth 2 (two) times daily. 01/06/24  Yes Corbin Dess, PA-C  predniSONE (DELTASONE) 20 MG tablet Take 1 tablet (20 mg total) by mouth daily with breakfast. 01/06/24  Yes Corbin Dess, PA-C  ALPRAZolam (XANAX) 0.25 MG tablet Take 0.25 mg by mouth.    [provider]  aspirin 81 MG EC tablet Take by mouth.    [provider]  aspirin EC 81 MG tablet Take 162 mg by mouth.    [provider]  benzonatate (TESSALON) 100 MG capsule  Take 1 capsule (100 mg total) by mouth 3 (three) times daily as needed for cough. 01/19/21   Buena Carmine, NP  donepezil (ARICEPT) 5 MG tablet Take 5 mg by mouth daily. 01/11/21   [provider]  doxycycline (VIBRAMYCIN) 100 MG capsule Take 1 capsule (100 mg total) by mouth 2 (two) times daily. 01/19/21   Buena Carmine, NP  ezetimibe (ZETIA) 10 MG tablet Take by mouth.    [provider]  GEMTESA 75 MG TABS Take 1 tablet by mouth daily as needed. 05/19/21   [provider]  glipiZIDE (GLUCOTROL XL) 2.5 MG 24 hr tablet Take 2.5 mg by mouth.    [provider]  JANUVIA 100 MG tablet Take 100 mg by mouth daily. 01/09/21   [provider]  losartan-hydrochlorothiazide (HYZAAR) 50-12.5 MG tablet Take 1 tablet by mouth.    [provider]  memantine (NAMENDA) 5 MG tablet Take 5 mg by mouth 2 (two) times daily. 12/29/20   [provider]  omeprazole (PRILOSEC) 20 MG capsule Take 20 mg by mouth.    [provider]  omeprazole (PRILOSEC) 20 MG capsule Take by mouth.    [provider]  ranitidine (ZANTAC) 150 MG capsule Take 150 mg by mouth.    [provider]  ranitidine (ZANTAC) 150 MG capsule Take by mouth.    [provider]  TRADJENTA 5 MG  TABS tablet Take 5 mg by mouth daily. 09/17/15   [provider]  traMADol (ULTRAM) 50 MG tablet Take 50 mg by mouth.    [provider]  traMADol (ULTRAM) 50 MG tablet Take by mouth.    [provider]  Vitamin E 268 MG (400 UNIT) CAPS Take by mouth.    [provider]    Family History Family History  Problem Relation Age of Onset   Healthy Mother    Healthy Father     Social History Social History   Tobacco Use   Smoking status: Never   Smokeless tobacco: Never     Allergies   Patient has no known allergies.   Review of Systems Review of Systems PER HPI  Physical Exam Triage Vital Signs ED Triage  Vitals  Encounter Vitals Group     BP 01/06/24 1127 99/72     Systolic BP Percentile --      Diastolic BP Percentile --      Pulse Rate 01/06/24 1127 (!) 110     Resp 01/06/24 1127 20     Temp 01/06/24 1127 98.7 F (37.1 C)     Temp Source 01/06/24 1127 Oral     SpO2 01/06/24 1127 90 %     Weight --      Height --      Head Circumference --      Peak Flow --      Pain Score 01/06/24 1130 0     Pain Loc --      Pain Education --      Exclude from Growth Chart --    No data found.  Updated Vital Signs BP 99/72 (BP Location: Right Arm)   Pulse (!) 110   Temp 98.7 F (37.1 C) (Oral)   Resp 20   SpO2 90%   Visual Acuity Right Eye Distance:   Left Eye Distance:   Bilateral Distance:    Right Eye Near:   Left Eye Near:    Bilateral Near:     Physical Exam Vitals and nursing note reviewed.  Constitutional:      Appearance: Normal appearance.  HENT:     Head: Atraumatic.     Right Ear: Tympanic membrane and external ear normal.     Left Ear: Tympanic membrane and external ear normal.     Nose: Congestion present.     Mouth/Throat:     Mouth: Mucous membranes are moist.     Pharynx: Posterior oropharyngeal erythema present.  Eyes:     Extraocular Movements: Extraocular movements intact.     Conjunctiva/sclera: Conjunctivae normal.  Cardiovascular:     Rate and Rhythm: Normal rate and regular rhythm.  Pulmonary:     Effort: Pulmonary effort is normal. No respiratory distress.     Breath sounds: Wheezing and rales present.  Musculoskeletal:        General: Normal range of motion.     Cervical back: Normal range of motion and neck supple.  Skin:    General: Skin is warm and dry.  Neurological:     Mental Status: She is alert and oriented to person, place, and time.  Psychiatric:        Mood and Affect: Mood normal.        Thought Content: Thought content normal.      UC Treatments / Results  Labs (all labs ordered are listed, but only abnormal results are  displayed) Labs Reviewed - No data to display  EKG   Radiology No results found.  Procedures Procedures (including critical care time)  Medications Ordered in UC Medications  albuterol (PROVENTIL) (2.5 MG/3ML) 0.083% nebulizer solution 2.5 mg (2.5 mg Nebulization Given 01/06/24 1149)    Initial Impression / Assessment and Plan / UC Course  I have reviewed the triage vital signs and the nursing notes.  Pertinent labs & imaging results that were available during my care of the patient were reviewed by me and considered in my medical decision making (see chart for details).     Tachycardic and borderline hypoxic at 90% on room air in triage, no significant improvement after albuterol nebulizer treatment though heart rate did improve to 86 bpm by time of discharge.  She does not appear in any acute distress and is comfortable managing symptoms at home with prescription medications at this time though did discuss to go to the emergency department if symptoms worsen in any way at any time.  Will treat for lower respiratory infection with doxycycline, very low-dose of prednisone with close monitoring of blood sugars, Mucinex, albuterol inhaler as needed.  Discussed supportive home care and return precautions.  Final Clinical Impressions(s) / UC Diagnoses   Final diagnoses:  Lower respiratory infection  SOB (shortness of breath)  Wheezing  Other fatigue     Discharge Instructions      Take the prescribed medications as directed, drink plenty of fluids, make sure to be taking deep breaths and coughing up as much congestion as possible.  Follow-up with your primary care provider in the next few days for a recheck and go to the emergency department if worsening at any time    ED Prescriptions     Medication Sig Dispense Auth. Provider   predniSONE (DELTASONE) 20 MG tablet Take 1 tablet (20 mg total) by mouth daily with breakfast. 5 tablet Corbin Dess, PA-C   guaiFENesin  (MUCINEX) 600 MG 12 hr tablet Take 1 tablet (600 mg total) by mouth 2 (two) times daily. 20 tablet Corbin Dess, PA-C   doxycycline (VIBRAMYCIN) 100 MG capsule Take 1 capsule (100 mg total) by mouth 2 (two) times daily. 20 capsule Corbin Dess, PA-C   albuterol (VENTOLIN HFA) 108 (90 Base) MCG/ACT inhaler Inhale 2 puffs into the lungs every 4 (four) hours as needed. 18 g Corbin Dess, [place]      PDMP not reviewed this encounter.   Corbin Dess, [place] 01/06/24 1954

## 2024-01-09 DIAGNOSIS — J019 Acute sinusitis, unspecified: Secondary | ICD-10-CM | POA: Diagnosis not present

## 2024-01-09 DIAGNOSIS — E119 Type 2 diabetes mellitus without complications: Secondary | ICD-10-CM | POA: Diagnosis not present

## 2024-01-09 DIAGNOSIS — R0600 Dyspnea, unspecified: Secondary | ICD-10-CM | POA: Diagnosis not present

## 2024-04-03 DIAGNOSIS — E118 Type 2 diabetes mellitus with unspecified complications: Secondary | ICD-10-CM | POA: Diagnosis not present

## 2024-04-03 DIAGNOSIS — E782 Mixed hyperlipidemia: Secondary | ICD-10-CM | POA: Diagnosis not present

## 2024-04-07 DIAGNOSIS — E1142 Type 2 diabetes mellitus with diabetic polyneuropathy: Secondary | ICD-10-CM | POA: Diagnosis not present

## 2024-04-07 DIAGNOSIS — B351 Tinea unguium: Secondary | ICD-10-CM | POA: Diagnosis not present

## 2024-04-09 DIAGNOSIS — G720 Drug-induced myopathy: Secondary | ICD-10-CM | POA: Diagnosis not present

## 2024-04-09 DIAGNOSIS — M545 Low back pain, unspecified: Secondary | ICD-10-CM | POA: Diagnosis not present

## 2024-04-09 DIAGNOSIS — E782 Mixed hyperlipidemia: Secondary | ICD-10-CM | POA: Diagnosis not present

## 2024-04-09 DIAGNOSIS — K219 Gastro-esophageal reflux disease without esophagitis: Secondary | ICD-10-CM | POA: Diagnosis not present

## 2024-04-09 DIAGNOSIS — E118 Type 2 diabetes mellitus with unspecified complications: Secondary | ICD-10-CM | POA: Diagnosis not present

## 2024-04-09 DIAGNOSIS — N3281 Overactive bladder: Secondary | ICD-10-CM | POA: Diagnosis not present

## 2024-04-09 DIAGNOSIS — R419 Unspecified symptoms and signs involving cognitive functions and awareness: Secondary | ICD-10-CM | POA: Diagnosis not present

## 2024-04-09 DIAGNOSIS — E871 Hypo-osmolality and hyponatremia: Secondary | ICD-10-CM | POA: Diagnosis not present

## 2024-04-09 DIAGNOSIS — G609 Hereditary and idiopathic neuropathy, unspecified: Secondary | ICD-10-CM | POA: Diagnosis not present

## 2024-04-09 DIAGNOSIS — I10 Essential (primary) hypertension: Secondary | ICD-10-CM | POA: Diagnosis not present

## 2024-04-09 DIAGNOSIS — E1169 Type 2 diabetes mellitus with other specified complication: Secondary | ICD-10-CM | POA: Diagnosis not present

## 2024-04-28 DIAGNOSIS — E119 Type 2 diabetes mellitus without complications: Secondary | ICD-10-CM | POA: Diagnosis not present

## 2024-06-16 DIAGNOSIS — B351 Tinea unguium: Secondary | ICD-10-CM | POA: Diagnosis not present

## 2024-06-16 DIAGNOSIS — E1151 Type 2 diabetes mellitus with diabetic peripheral angiopathy without gangrene: Secondary | ICD-10-CM | POA: Diagnosis not present

## 2024-08-05 DIAGNOSIS — E118 Type 2 diabetes mellitus with unspecified complications: Secondary | ICD-10-CM | POA: Diagnosis not present

## 2024-08-11 DIAGNOSIS — E118 Type 2 diabetes mellitus with unspecified complications: Secondary | ICD-10-CM | POA: Diagnosis not present

## 2024-08-11 DIAGNOSIS — Z23 Encounter for immunization: Secondary | ICD-10-CM | POA: Diagnosis not present

## 2024-08-11 DIAGNOSIS — E871 Hypo-osmolality and hyponatremia: Secondary | ICD-10-CM | POA: Diagnosis not present

## 2024-08-11 DIAGNOSIS — E782 Mixed hyperlipidemia: Secondary | ICD-10-CM | POA: Diagnosis not present

## 2024-08-11 DIAGNOSIS — E1169 Type 2 diabetes mellitus with other specified complication: Secondary | ICD-10-CM | POA: Diagnosis not present

## 2024-08-11 DIAGNOSIS — G720 Drug-induced myopathy: Secondary | ICD-10-CM | POA: Diagnosis not present

## 2024-08-11 DIAGNOSIS — Z0000 Encounter for general adult medical examination without abnormal findings: Secondary | ICD-10-CM | POA: Diagnosis not present

## 2024-08-11 DIAGNOSIS — I10 Essential (primary) hypertension: Secondary | ICD-10-CM | POA: Diagnosis not present

## 2024-08-11 DIAGNOSIS — K219 Gastro-esophageal reflux disease without esophagitis: Secondary | ICD-10-CM | POA: Diagnosis not present

## 2024-08-11 DIAGNOSIS — Z0001 Encounter for general adult medical examination with abnormal findings: Secondary | ICD-10-CM | POA: Diagnosis not present

## 2024-08-11 DIAGNOSIS — R419 Unspecified symptoms and signs involving cognitive functions and awareness: Secondary | ICD-10-CM | POA: Diagnosis not present

## 2024-08-11 DIAGNOSIS — M545 Low back pain, unspecified: Secondary | ICD-10-CM | POA: Diagnosis not present
